# Patient Record
Sex: Female | Born: 1975 | Race: Black or African American | Hispanic: No | Marital: Married | Smoking: Never smoker
Health system: Southern US, Community
[De-identification: ages and names within clinical notes are randomized; demographics above are authoritative.]

## PROBLEM LIST (undated history)

## (undated) DIAGNOSIS — G4700 Insomnia, unspecified: Secondary | ICD-10-CM

## (undated) DIAGNOSIS — T7840XA Allergy, unspecified, initial encounter: Secondary | ICD-10-CM

## (undated) DIAGNOSIS — I10 Essential (primary) hypertension: Secondary | ICD-10-CM

## (undated) DIAGNOSIS — K589 Irritable bowel syndrome without diarrhea: Secondary | ICD-10-CM

## (undated) DIAGNOSIS — G8929 Other chronic pain: Secondary | ICD-10-CM

## (undated) DIAGNOSIS — R51 Headache: Secondary | ICD-10-CM

## (undated) DIAGNOSIS — F39 Unspecified mood [affective] disorder: Secondary | ICD-10-CM

## (undated) DIAGNOSIS — R519 Headache, unspecified: Secondary | ICD-10-CM

## (undated) HISTORY — DX: Unspecified mood (affective) disorder: F39

## (undated) HISTORY — DX: Insomnia, unspecified: G47.00

## (undated) HISTORY — DX: Irritable bowel syndrome, unspecified: K58.9

## (undated) HISTORY — DX: Allergy, unspecified, initial encounter: T78.40XA

## (undated) HISTORY — DX: Other chronic pain: G89.29

## (undated) HISTORY — DX: Headache, unspecified: R51.9

## (undated) HISTORY — DX: Essential (primary) hypertension: I10

## (undated) HISTORY — DX: Headache: R51

---

## 2002-05-06 ENCOUNTER — Encounter: Admission: RE | Admit: 2002-05-06 | Discharge: 2002-08-04 | Payer: Self-pay | Admitting: Family Medicine

## 2003-06-15 ENCOUNTER — Other Ambulatory Visit: Admission: RE | Admit: 2003-06-15 | Discharge: 2003-06-15 | Payer: Self-pay | Admitting: Family Medicine

## 2003-12-29 ENCOUNTER — Ambulatory Visit (HOSPITAL_COMMUNITY): Admission: RE | Admit: 2003-12-29 | Discharge: 2003-12-29 | Payer: Self-pay | Admitting: Family Medicine

## 2005-12-30 ENCOUNTER — Ambulatory Visit: Payer: Self-pay | Admitting: Family Medicine

## 2005-12-30 ENCOUNTER — Other Ambulatory Visit: Admission: RE | Admit: 2005-12-30 | Discharge: 2005-12-30 | Payer: Self-pay | Admitting: Family Medicine

## 2006-01-13 ENCOUNTER — Encounter: Admission: RE | Admit: 2006-01-13 | Discharge: 2006-04-13 | Payer: Self-pay | Admitting: Family Medicine

## 2006-01-29 ENCOUNTER — Ambulatory Visit: Payer: Self-pay | Admitting: Family Medicine

## 2006-11-28 ENCOUNTER — Ambulatory Visit: Payer: Self-pay | Admitting: Family Medicine

## 2007-01-26 ENCOUNTER — Other Ambulatory Visit: Admission: RE | Admit: 2007-01-26 | Discharge: 2007-01-26 | Payer: Self-pay | Admitting: Family Medicine

## 2007-01-26 ENCOUNTER — Ambulatory Visit: Payer: Self-pay | Admitting: Family Medicine

## 2007-06-17 ENCOUNTER — Ambulatory Visit: Payer: Self-pay | Admitting: Family Medicine

## 2007-06-24 ENCOUNTER — Encounter: Admission: RE | Admit: 2007-06-24 | Discharge: 2007-06-24 | Payer: Self-pay | Admitting: Family Medicine

## 2007-09-28 ENCOUNTER — Ambulatory Visit: Payer: Self-pay | Admitting: Family Medicine

## 2008-04-21 ENCOUNTER — Ambulatory Visit: Payer: Self-pay | Admitting: Family Medicine

## 2008-04-21 ENCOUNTER — Other Ambulatory Visit: Admission: RE | Admit: 2008-04-21 | Discharge: 2008-04-21 | Payer: Self-pay | Admitting: Family Medicine

## 2008-05-03 ENCOUNTER — Ambulatory Visit: Payer: Self-pay | Admitting: Family Medicine

## 2008-06-17 ENCOUNTER — Ambulatory Visit: Payer: Self-pay | Admitting: Family Medicine

## 2008-08-30 ENCOUNTER — Ambulatory Visit: Payer: Self-pay | Admitting: Family Medicine

## 2008-09-13 ENCOUNTER — Ambulatory Visit: Payer: Self-pay | Admitting: Family Medicine

## 2008-12-29 ENCOUNTER — Ambulatory Visit: Payer: Self-pay | Admitting: Family Medicine

## 2009-08-15 ENCOUNTER — Ambulatory Visit: Payer: Self-pay | Admitting: Physician Assistant

## 2009-08-15 ENCOUNTER — Other Ambulatory Visit: Admission: RE | Admit: 2009-08-15 | Discharge: 2009-08-15 | Payer: Self-pay | Admitting: Family Medicine

## 2009-10-23 ENCOUNTER — Ambulatory Visit: Payer: Self-pay | Admitting: Family Medicine

## 2010-03-27 ENCOUNTER — Ambulatory Visit
Admission: RE | Admit: 2010-03-27 | Discharge: 2010-03-27 | Payer: Self-pay | Source: Home / Self Care | Attending: Family Medicine | Admitting: Family Medicine

## 2010-04-24 ENCOUNTER — Ambulatory Visit (INDEPENDENT_AMBULATORY_CARE_PROVIDER_SITE_OTHER): Payer: 59 | Admitting: Family Medicine

## 2010-04-24 DIAGNOSIS — R51 Headache: Secondary | ICD-10-CM

## 2010-04-24 DIAGNOSIS — F39 Unspecified mood [affective] disorder: Secondary | ICD-10-CM

## 2010-06-19 ENCOUNTER — Ambulatory Visit (INDEPENDENT_AMBULATORY_CARE_PROVIDER_SITE_OTHER): Payer: 59 | Admitting: Family Medicine

## 2010-06-19 DIAGNOSIS — R51 Headache: Secondary | ICD-10-CM

## 2010-07-20 ENCOUNTER — Encounter: Payer: Self-pay | Admitting: Family Medicine

## 2010-07-24 ENCOUNTER — Encounter: Payer: Self-pay | Admitting: Family Medicine

## 2010-07-24 ENCOUNTER — Ambulatory Visit (INDEPENDENT_AMBULATORY_CARE_PROVIDER_SITE_OTHER): Payer: 59 | Admitting: Family Medicine

## 2010-07-24 VITALS — BP 144/90 | HR 100 | Wt 290.0 lb

## 2010-07-24 DIAGNOSIS — E669 Obesity, unspecified: Secondary | ICD-10-CM

## 2010-07-24 DIAGNOSIS — N926 Irregular menstruation, unspecified: Secondary | ICD-10-CM

## 2010-07-24 DIAGNOSIS — Z8742 Personal history of other diseases of the female genital tract: Secondary | ICD-10-CM

## 2010-07-24 DIAGNOSIS — N912 Amenorrhea, unspecified: Secondary | ICD-10-CM

## 2010-07-24 LAB — POCT URINE PREGNANCY: Preg Test, Ur: NEGATIVE

## 2010-07-24 LAB — HCG, SERUM, QUALITATIVE: Preg, Serum: NEGATIVE

## 2010-07-24 NOTE — Progress Notes (Signed)
  Subjective:    Patient ID: Tamara Mckenzie, female    DOB: [birth year], 35 y.o.   MRN: [id number]  HPI she has missed the last 3 menstrual cycles and is concerned about pregnancy. She has had 3 home pregnancy tests were negative. She is normally fairly regular. She presently isn't on birth control because several of them cause worsening of her headaches. She is here for recheck on her headaches. On her last visit the amitriptyline was increased to 50 mg. She states she is roughly 80% better. She gets a headache she can take a BC powder and it will go away. Now she gets one headache a week.    Review of Systems     Objective:   Physical Exam alert and in no distress. Urine pregnancy test negative.        Assessment & Plan:  Amenorrhea. Obesity. I will do a serum pregnancy test. We also discussed diet and exercise as well as a psychological component of this. We've discussed surgical intervention. I will refer her to nutritionist and possibly to counseling.

## 2010-07-24 NOTE — Patient Instructions (Signed)
I will call you with the information on the nutritionist well as the report on the serum pregnancy test

## 2010-07-26 ENCOUNTER — Telehealth: Payer: Self-pay

## 2010-07-26 NOTE — Telephone Encounter (Signed)
Called and left message to let pt know labs come back normal

## 2010-11-28 ENCOUNTER — Other Ambulatory Visit (INDEPENDENT_AMBULATORY_CARE_PROVIDER_SITE_OTHER): Payer: 59

## 2010-11-28 DIAGNOSIS — Z23 Encounter for immunization: Secondary | ICD-10-CM

## 2011-02-04 ENCOUNTER — Other Ambulatory Visit: Payer: Self-pay | Admitting: Obstetrics and Gynecology

## 2011-02-04 DIAGNOSIS — R928 Other abnormal and inconclusive findings on diagnostic imaging of breast: Secondary | ICD-10-CM

## 2011-02-21 ENCOUNTER — Ambulatory Visit
Admission: RE | Admit: 2011-02-21 | Discharge: 2011-02-21 | Disposition: A | Discharge status: Home / Self Care | Payer: 59 | Source: Ambulatory Visit | Attending: Obstetrics and Gynecology | Admitting: Obstetrics and Gynecology

## 2011-02-21 DIAGNOSIS — R928 Other abnormal and inconclusive findings on diagnostic imaging of breast: Secondary | ICD-10-CM

## 2011-02-21 LAB — HM MAMMOGRAPHY

## 2011-03-12 ENCOUNTER — Encounter: Payer: Self-pay | Admitting: Medical

## 2011-03-12 ENCOUNTER — Ambulatory Visit (INDEPENDENT_AMBULATORY_CARE_PROVIDER_SITE_OTHER): Payer: 59 | Admitting: Medical

## 2011-03-12 VITALS — BP 133/80 | HR 80 | Temp 99.1°F | Resp 16 | Wt 290.0 lb

## 2011-03-12 DIAGNOSIS — J329 Chronic sinusitis, unspecified: Secondary | ICD-10-CM

## 2011-03-12 DIAGNOSIS — J029 Acute pharyngitis, unspecified: Secondary | ICD-10-CM | POA: Insufficient documentation

## 2011-03-12 LAB — POCT RAPID STREP A (OFFICE): Rapid Strep A Screen: NEGATIVE

## 2011-03-12 MED ORDER — AMOXICILLIN-POT CLAVULANATE 875-125 MG PO TABS: 1.0000 | ORAL_TABLET | Freq: Two times a day (BID) | ORAL | Status: AC

## 2011-03-12 NOTE — Progress Notes (Signed)
Subjective:  Tamara Mckenzie is a 36 y.o. female who presents for 4-5 day history of scratchy throat, sinus pressure, left ear fullness, occasional cough, some yellow/clear nasal drainage, one-day history of body aches, and feeling hot and cold alternatively. She has positive sick contacts at work. In the past upper respiratory infections turn into sinus infections easily with her. Her last similar episode was over year ago that required steroid and antibiotic.  She did get a flu shot this year . No other aggravating or relieving factors.  No other c/o.  Past Medical History  Diagnosis Date  . Hypertension   . IBS (irritable bowel syndrome)   . Genital herpes    Review of systems: Gen.: Positive sweats Cardiac: No chest pain or palpitations Lungs: No shortness of breath or wheezing GI: Negative   Objective:   Filed Vitals:   03/12/11 1056  BP: 133/80  Pulse: 80  Temp: 99.1 F (37.3 C)  Resp: 16    General appearance: Alert, WD/WN, no distress                             Skin: warm, no rash                           Head: + Moderate frontal sinus tenderness,                            Eyes: conjunctiva normal, corneas clear, PERRLA                            Ears: pearly TMs, external ear canals normal                          Nose: septum midline, turbinates swollen, with erythema and clear discharge             Mouth/throat: MMM, tongue normal, mild pharyngeal erythema                           Neck: supple, no adenopathy, no thyromegaly, nontender                          Heart: RRR, normal S1, S2, no murmurs                         Lungs: CTA bilaterally, no wheezes, rales, or rhonchi      Assessment and Plan:   Encounter Diagnoses  Name Primary?  . Sinusitis Yes  . Pharyngitis    Strep test negative. Prescription given for Augmentin.  Can use OTC Coricidin HBP for congestion.  Tylenol or Ibuprofen OTC for fever and malaise.  Discussed symptomatic relief, nasal saline,  and call or return if worse or not improving in 2-3 days.

## 2011-03-12 NOTE — Patient Instructions (Signed)

## 2011-04-17 ENCOUNTER — Encounter: Payer: Self-pay | Admitting: Medical

## 2011-04-17 ENCOUNTER — Ambulatory Visit (INDEPENDENT_AMBULATORY_CARE_PROVIDER_SITE_OTHER): Payer: BC Managed Care – PPO | Admitting: Medical

## 2011-04-17 VITALS — BP 138/90 | HR 96 | Temp 98.7°F | Resp 18 | Wt 288.5 lb

## 2011-04-17 DIAGNOSIS — I10 Essential (primary) hypertension: Secondary | ICD-10-CM

## 2011-04-17 DIAGNOSIS — H539 Unspecified visual disturbance: Secondary | ICD-10-CM

## 2011-04-17 MED ORDER — ATENOLOL 50 MG PO TABS: 50.0000 mg | ORAL_TABLET | Freq: Every day | ORAL | Status: DC

## 2011-04-17 NOTE — Patient Instructions (Signed)
Recheck 2-3 weeks

## 2011-04-17 NOTE — Progress Notes (Signed)
Subjective:   HPI  Tamara Mckenzie is a 36 y.o. female who presents with c/o vision disturbance and BP elevated.  She has hx/o hypertension, and she notes 2 days ago was at dinner when all of the sudden she felt her vision go out in both eyes for 3-4 seconds.  She had some ringing in her ears as well, but otherwise felt fine.  She had no other symptoms at the time.  She thinks it caught her off guard, but denies unconscious, syncope, faint, or other.  This even scared her, but otherwise had no symptoms.  She has also been noticing that her BP has been running 130-160 SBP and 88-97 DBP.  She is exercising regularly.  She did begin OCPs 3 months ago.  No hx/o DVT/PE.  She thinks at her last vision exam in the past year, there was some mention about BP related changes in the back of her eyes, but no glaucoma.  She denies hx/o heart problems, seizure, TIA/CVA.    Although she had no other symptoms with the vision disturbance, she does get 4-5 headaches per month, usually one sided on the right, lasts 45-72min.  The headaches usually cause photophobia, but no nausea.  She also has hx/o TMJ problems.  She notes no recent stressors, no recent allergy or sinus problems, not using much caffeine.  She is a nonsmoker.  No other c/o.  The following portions of the patient's history were reviewed and updated as appropriate: allergies, current medications, past family history, past medical history, past social history, past surgical history and problem list.  No Known Allergies  Current Outpatient Prescriptions on File Prior to Visit  Medication Sig Dispense Refill  . ALPRAZolam (XANAX) 0.25 MG tablet Take 0.25 mg by mouth at bedtime as needed.        Marland Kitchen amitriptyline (ELAVIL) 25 MG tablet Take 25 mg by mouth at bedtime.        . drospirenone-ethinyl estradiol (YASMIN,ZARAH,SYEDA) 3-0.03 MG tablet Take 1 tablet by mouth daily.      . fluticasone (FLONASE) 50 MCG/ACT nasal spray 2 sprays by Nasal route daily.         . valACYclovir (VALTREX) 500 MG tablet Take 500 mg by mouth daily.        . vitamin C (ASCORBIC ACID) 500 MG tablet Take 500 mg by mouth daily.          Past Medical History  Diagnosis Date  . Hypertension   . IBS (irritable bowel syndrome)   . Genital herpes   . Mood disorder     History reviewed. No pertinent past surgical history.  Family History  Problem Relation Age of Onset  . Hypertension Mother   . Hypertension Father   . Hyperlipidemia Father   . Stroke Maternal Grandfather   . Heart disease Paternal Grandfather     History   Social History  . Marital Status: Single    Spouse Name: N/A    Number of Children: N/A  . Years of Education: N/A   Occupational History  . Not on file.   Social History Main Topics  . Smoking status: Never Smoker   . Smokeless tobacco: Never Used  . Alcohol Use: No  . Drug Use: No  . Sexually Active: Not on file   Other Topics Concern  . Not on file   Social History Narrative  . No narrative on file    Review of Systems Gen: No fever, chills, sweats Skin: No  rash HEENT: Negative Heart: No chest pain, occasional palpitation, no edema Lungs: No shortness or wheezing GI: No pain, nausea vomiting diarrhea GU negative Neuro: tinnitus, vision disturbance, otherwise negative     Objective:   Physical Exam  General appearance: alert, no distress, WD/WN HEENT: normocephalic, sclerae anicteric, TMs pearly, nares patent, no discharge or erythema, pharynx normal Oral cavity: MMM, no lesions Neck: supple, no lymphadenopathy, no thyromegaly, no masses, no bruits Heart: RRR, normal S1, S2, no murmurs Lungs: CTA bilaterally, no wheezes, rhonchi, or rales Pulses: 2+ symmetric, upper and lower extremities, normal cap refill Neuro: CN2-12 intact, normal strength and sensation, A&O x 4, non focal   Assessment and Plan :     Encounter Diagnoses  Name Primary?  . Essential hypertension, benign Yes  . Vision changes    HTN and  vision changes - we discussed her symptoms and concerns .  The symptoms were brief and have not returned.  Discussed differential that could include hypertension, atypical migraine, seizure, other, and at this point she declines additional evaluation. She will watch for any new changes or recurrence of the symptoms. We discussed signs of stroke or seizure that would prompt a call 911. I did increase her atenolol to 50 mg daily. She'll return in 2-3 weeks, or call return sooner if needed.

## 2011-05-03 ENCOUNTER — Encounter: Payer: Self-pay | Admitting: Medical

## 2011-05-03 ENCOUNTER — Ambulatory Visit (INDEPENDENT_AMBULATORY_CARE_PROVIDER_SITE_OTHER): Payer: BC Managed Care – PPO | Admitting: Medical

## 2011-05-03 VITALS — BP 122/80 | HR 92 | Temp 98.9°F | Resp 18 | Wt 292.0 lb

## 2011-05-03 DIAGNOSIS — E669 Obesity, unspecified: Secondary | ICD-10-CM

## 2011-05-03 DIAGNOSIS — A6000 Herpesviral infection of urogenital system, unspecified: Secondary | ICD-10-CM

## 2011-05-03 DIAGNOSIS — I10 Essential (primary) hypertension: Secondary | ICD-10-CM | POA: Insufficient documentation

## 2011-05-03 MED ORDER — VALACYCLOVIR HCL 500 MG PO TABS: 500.0000 mg | ORAL_TABLET | Freq: Every day | ORAL | Status: DC

## 2011-05-03 NOTE — Progress Notes (Signed)
Subjective:   HPI  Tamara Mckenzie is a 36 y.o. female who presents for recheck.  Last visit she had some vision disturbance and dizziness.  Since last visit increasing her BP medication, she has had no further symptoms. Feels fine.  She has been checking BPs, and they have all been in normal range now - 120s/80s.  She has also had less headaches since last visit.   She needs refill on Valrex today.  diagnosis with genital herpes in 2010.    No other c/o.  The following portions of the patient's history were reviewed and updated as appropriate: allergies, current medications, past family history, past medical history, past social history, past surgical history and problem list.  No Known Allergies  Current Outpatient Prescriptions on File Prior to Visit  Medication Sig Dispense Refill  . ALPRAZolam (XANAX) 0.25 MG tablet Take 0.25 mg by mouth at bedtime as needed.        Marland Kitchen amitriptyline (ELAVIL) 25 MG tablet Take 25 mg by mouth at bedtime.        Marland Kitchen atenolol (TENORMIN) 50 MG tablet Take 1 tablet (50 mg total) by mouth daily.  90 tablet  0  . drospirenone-ethinyl estradiol (YASMIN,ZARAH,SYEDA) 3-0.03 MG tablet Take 1 tablet by mouth daily.      . fluticasone (FLONASE) 50 MCG/ACT nasal spray 2 sprays by Nasal route daily.        . vitamin C (ASCORBIC ACID) 500 MG tablet Take 500 mg by mouth daily.          Past Medical History  Diagnosis Date  . Hypertension   . IBS (irritable bowel syndrome)   . Genital herpes   . Mood disorder     No past surgical history on file.  Family History  Problem Relation Age of Onset  . Hypertension Mother   . Hypertension Father   . Hyperlipidemia Father   . Stroke Maternal Grandfather   . Heart disease Paternal Grandfather     History   Social History  . Marital Status: Single    Spouse Name: N/A    Number of Children: N/A  . Years of Education: N/A   Occupational History  . Not on file.   Social History Main Topics  . Smoking status:  Never Smoker   . Smokeless tobacco: Never Used  . Alcohol Use: No  . Drug Use: No  . Sexually Active: Not on file   Other Topics Concern  . Not on file   Social History Narrative  . No narrative on file    Review of Systems Gen: No fever, chills, sweats Skin: No rash HEENT: Negative Heart: No chest pain, occasional palpitation, no edema Lungs: No shortness or wheezing GI: No pain, nausea vomiting diarrhea GU negative Neuro: negative, no further dizziness     Objective:   Physical Exam  General appearance: alert, no distress, WD/WN Oral cavity: MMM, no lesions Heart: RRR, normal S1, S2, no murmurs Lungs: CTA bilaterally, no wheezes, rhonchi, or rales Pulses: 2+ symmetric, upper and lower extremities, normal cap refill  Assessment and Plan :     Encounter Diagnoses  Name Primary?  . Essential hypertension, benign Yes  . Genital herpes   . Obesity    HTN - improved on Atenolol 50mg  daily.  No further dizziness or spells as she had last visit.   Genital herpes - controled with once daily Valtrex.  Refill today.   Obesity - discussed diet, exercise, and she will work on  diet, c/t exercise, and recheck 59mo for physical.

## 2011-06-22 ENCOUNTER — Other Ambulatory Visit: Payer: Self-pay | Admitting: Family Medicine

## 2011-09-13 ENCOUNTER — Ambulatory Visit (INDEPENDENT_AMBULATORY_CARE_PROVIDER_SITE_OTHER): Payer: BC Managed Care – PPO | Admitting: Medical

## 2011-09-13 ENCOUNTER — Encounter: Payer: Self-pay | Admitting: Medical

## 2011-09-13 VITALS — BP 110/80 | HR 100 | Temp 98.9°F | Resp 16 | Wt 280.0 lb

## 2011-09-13 DIAGNOSIS — Z113 Encounter for screening for infections with a predominantly sexual mode of transmission: Secondary | ICD-10-CM

## 2011-09-13 DIAGNOSIS — E669 Obesity, unspecified: Secondary | ICD-10-CM

## 2011-09-13 DIAGNOSIS — I10 Essential (primary) hypertension: Secondary | ICD-10-CM

## 2011-09-13 LAB — POCT URINALYSIS DIPSTICK
Bilirubin, UA: NEGATIVE
Blood, UA: NEGATIVE
Glucose, UA: NEGATIVE
Ketones, UA: NEGATIVE
Leukocytes, UA: NEGATIVE
Nitrite, UA: NEGATIVE
Protein, UA: NEGATIVE
Spec Grav, UA: 1.02
Urobilinogen, UA: NEGATIVE
pH, UA: 5

## 2011-09-13 LAB — CBC
HCT: 37.4 % (ref 36.0–46.0)
Hemoglobin: 12.4 g/dL (ref 12.0–15.0)
MCH: 27.6 pg (ref 26.0–34.0)
MCHC: 33.2 g/dL (ref 30.0–36.0)
MCV: 83.3 fL (ref 78.0–100.0)
Platelets: 445 10*3/uL — ABNORMAL HIGH (ref 150–400)
RBC: 4.49 MIL/uL (ref 3.87–5.11)
RDW: 15.2 % (ref 11.5–15.5)
WBC: 6 10*3/uL (ref 4.0–10.5)

## 2011-09-13 MED ORDER — ATENOLOL 50 MG PO TABS: 50.0000 mg | ORAL_TABLET | Freq: Every day | ORAL | Status: DC

## 2011-09-13 NOTE — Progress Notes (Signed)
Subjective:   HPI  Tamara Mckenzie is a 36 y.o. female who presents for general recheck.  She is compliant with her blood pressure medication.  She has lost weight, been exercising with a combination of walking, Zumba and other activity as well as improving her diet.  She got a puppy and the puppy is active keeping her exercising.  She is fasting today for labs.   Takes Elavil for mood, sleep, and started this back in 2012.  Does well on this.   No other c/o.  The following portions of the patient's history were reviewed and updated as appropriate: allergies, current medications, past family history, past medical history, past social history, past surgical history and problem list.  Past Medical History  Diagnosis Date  . Hypertension   . IBS (irritable bowel syndrome)   . Genital herpes   . Mood disorder   . Chronic headache   . Insomnia     No Known Allergies   Review of Systems ROS reviewed and was negative other than noted in HPI or above.    Objective:   Physical Exam  General appearance: alert, no distress, WD/WN Oral cavity: MMM, no lesions Neck: supple, no lymphadenopathy, no thyromegaly, no masses Heart: RRR, normal S1, S2, no murmurs Lungs: CTA bilaterally, no wheezes, rhonchi, or rales Pulses: 2+ symmetric, upper and lower extremities, normal cap refill   Assessment and Plan :     Encounter Diagnoses  Name Primary?  . Essential hypertension, benign Yes  . Obesity   . Screening for STD (sexually transmitted disease)    HTN - controlled, c/t current medication, refills sent.  Obesity - she has lost weight and made significant diet and exercise changes.  C/t the good work  STD screening today  reviewed 2011 labs and pap.   Next pap due 2014.  Fasting labs today.

## 2011-09-13 NOTE — Addendum Note (Signed)
Addended by: Janeice Robinson on: 09/13/2011 10:38 AM   Modules accepted: Orders

## 2011-09-14 LAB — HIV ANTIBODY (ROUTINE TESTING W REFLEX): HIV: NONREACTIVE

## 2011-09-14 LAB — COMPREHENSIVE METABOLIC PANEL
ALT: 13 U/L (ref 0–35)
AST: 15 U/L (ref 0–37)
Albumin: 3.8 g/dL (ref 3.5–5.2)
Alkaline Phosphatase: 85 U/L (ref 39–117)
BUN: 10 mg/dL (ref 6–23)
CO2: 24 mEq/L (ref 19–32)
Calcium: 8.8 mg/dL (ref 8.4–10.5)
Chloride: 106 mEq/L (ref 96–112)
Creat: 0.78 mg/dL (ref 0.50–1.10)
Glucose, Bld: 84 mg/dL (ref 70–99)
Potassium: 4.3 mEq/L (ref 3.5–5.3)
Sodium: 138 mEq/L (ref 135–145)
Total Bilirubin: 0.3 mg/dL (ref 0.3–1.2)
Total Protein: 7.4 g/dL (ref 6.0–8.3)

## 2011-09-14 LAB — LIPID PANEL
Cholesterol: 168 mg/dL (ref 0–200)
HDL: 81 mg/dL (ref >39)
LDL Cholesterol: 70 mg/dL (ref 0–99)
Total CHOL/HDL Ratio: 2.1 Ratio
Triglycerides: 83 mg/dL (ref <150)
VLDL: 17 mg/dL (ref 0–40)

## 2011-09-14 LAB — GC/CHLAMYDIA PROBE AMP, URINE
Chlamydia, Swab/Urine, PCR: NEGATIVE
GC Probe Amp, Urine: NEGATIVE

## 2011-09-14 LAB — TSH: TSH: 1.624 u[IU]/mL (ref 0.350–4.500)

## 2011-09-29 ENCOUNTER — Other Ambulatory Visit: Payer: Self-pay | Admitting: Medical

## 2012-02-06 ENCOUNTER — Encounter: Payer: Self-pay | Admitting: Family Medicine

## 2012-02-06 ENCOUNTER — Ambulatory Visit (INDEPENDENT_AMBULATORY_CARE_PROVIDER_SITE_OTHER): Payer: BC Managed Care – PPO | Admitting: Family Medicine

## 2012-02-06 VITALS — BP 160/110 | HR 100 | Wt 291.0 lb

## 2012-02-06 DIAGNOSIS — I10 Essential (primary) hypertension: Secondary | ICD-10-CM

## 2012-02-06 DIAGNOSIS — J209 Acute bronchitis, unspecified: Secondary | ICD-10-CM

## 2012-02-06 DIAGNOSIS — J0410 Acute tracheitis without obstruction: Secondary | ICD-10-CM

## 2012-02-06 MED ORDER — ALBUTEROL SULFATE HFA 108 (90 BASE) MCG/ACT IN AERS: 2.0000 | INHALATION_SPRAY | Freq: Four times a day (QID) | RESPIRATORY_TRACT | Status: DC | PRN

## 2012-02-06 MED ORDER — AMOXICILLIN 875 MG PO TABS: 875.0000 mg | ORAL_TABLET | Freq: Two times a day (BID) | ORAL | Status: DC

## 2012-02-06 NOTE — Progress Notes (Signed)
  Subjective:    Patient ID: Tamara Mckenzie, female    DOB: [birth year], 36 y.o.   MRN: [id number]  HPI 2 weeks ago she started with rhinorrhea and PND followed a few days later with chest congestion followed by coughing. About 3 or 4 days ago she then had difficulty with headache however coughing does not make this worse. No fever chills, sore throat or earache. She does not smoke or have underlying allergies   Review of Systems     Objective:   Physical Exam alert and in no distress. Tympanic membranes and canals are normal. Throat is clear. Tonsils are normal. Neck is supple without adenopathy or thyromegaly. Cardiac exam shows a regular sinus rhythm without murmurs or gallops. Lungs are clear to auscultation.        Assessment & Plan:   1. Acute bronchitis  amoxicillin (AMOXIL) 875 MG tablet  2. Acute tracheitis  albuterol (PROVENTIL HFA;VENTOLIN HFA) 108 (90 BASE) MCG/ACT inhaler  3. Essential hypertension, benign     she is to return in one month for recheck on her blood pressure.

## 2012-02-06 NOTE — Patient Instructions (Signed)
Take 2 Aleve twice a day and use the antibiotic and I am also going to give you an inhaler. Use the inhaler to help with the coughing

## 2012-03-09 ENCOUNTER — Encounter: Payer: Self-pay | Admitting: Family Medicine

## 2012-03-09 ENCOUNTER — Ambulatory Visit (INDEPENDENT_AMBULATORY_CARE_PROVIDER_SITE_OTHER): Payer: BC Managed Care – PPO | Admitting: Family Medicine

## 2012-03-09 VITALS — BP 120/78 | HR 98 | Wt 284.0 lb

## 2012-03-09 DIAGNOSIS — J209 Acute bronchitis, unspecified: Secondary | ICD-10-CM

## 2012-03-09 MED ORDER — AMOXICILLIN 875 MG PO TABS: 875.0000 mg | ORAL_TABLET | Freq: Two times a day (BID) | ORAL | Status: DC

## 2012-03-09 NOTE — Progress Notes (Signed)
  Subjective:    Patient ID: Tamara Mckenzie, female    DOB: [birth year], 37 y.o.   MRN: [id number]  HPI She is here for recheck on her blood pressure. In the past when she was on birth control pills she noted a decrease in her blood pressure. She has been on Yaz or one year for cycle control. She stopped this 3 weeks ago and notes now that her blood pressure is normal. He also has continued difficulty with chest congestion and coughing. She does state that she is roughly 90% better from her last visit.   Review of Systems     Objective:   Physical Exam alert and in no distress. Tympanic membranes and canals are normal. Throat is clear. Tonsils are normal. Neck is supple without adenopathy or thyromegaly. Cardiac exam shows a regular sinus rhythm without murmurs or gallops. Lungs are clear to auscultation.        Assessment & Plan:   1. Acute bronchitis  amoxicillin (AMOXIL) 875 MG tablet   she will call if any problems. Since her blood pressure is now normal, no further intervention is needed.

## 2012-03-09 NOTE — Patient Instructions (Signed)
Take all the antibiotic. Increase your physical activities that your body will allow

## 2012-05-25 IMAGING — US US BREAST L
1 series · 1 of 1 positions shown · non-contrast
Comparison: 01/30/2011

CLINICAL DATA: The patient returns after baseline screening exam
for evaluation of the left breast.  Baseline exam was ordered
because the patient has two aunts with a history of breast cancer.

DIGITAL DIAGNOSTIC LEFT MAMMOGRAM  AND LEFT BREAST ULTRASOUND:

[Series 1: us breast left · 1 of 1 slices shown]
[im 1/1]
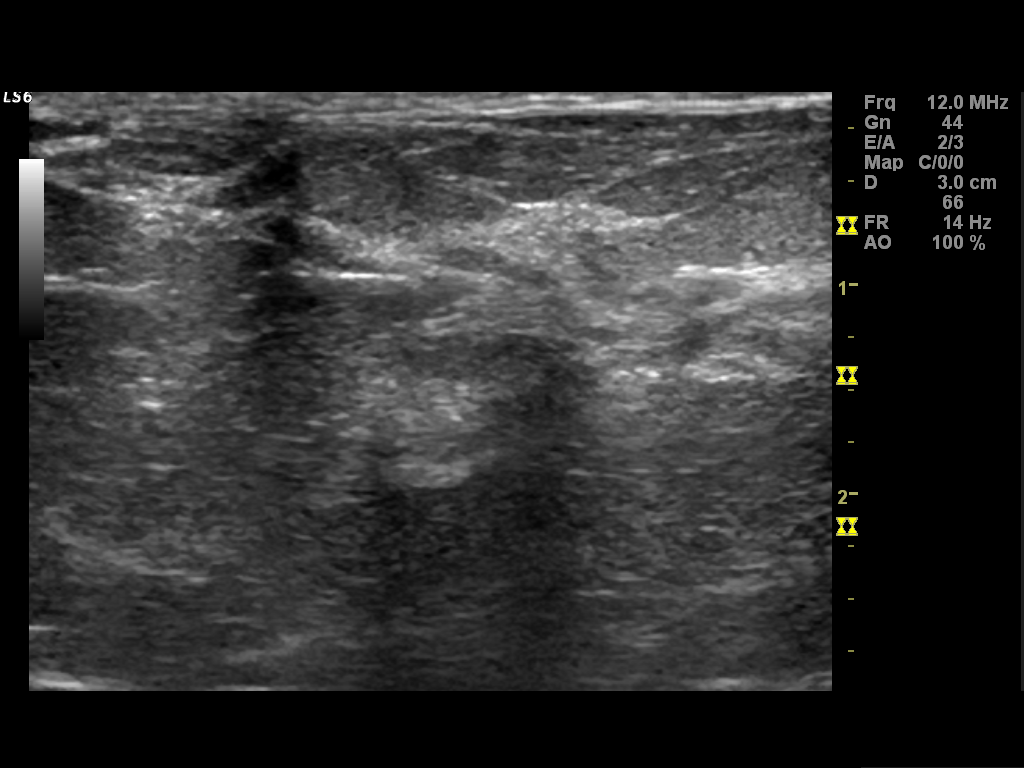

[1 of 1 positions shown; findings below may reference images not displayed]

FINDINGS: Spot compression views show no persistent abnormality
within the upper-outer quadrant of the left breast.

On physical exam, I palpate no abnormality within the upper outer
quadrant on the left.

Ultrasound is performed, showing normal-appearing fibroglandular
tissue throughout the upper-outer quadrant of the left breast.
Fibroglandular ridge is identified in the 3 o'clock location,
correlating well with the mammographic finding. No mass,
distortion, or acoustic shadowing is demonstrated with ultrasound.
IMPRESSION: No mammographic or ultrasound evidence for malignancy.  Annual
screening mammography is recommended to begin at age 40.

BI-RADS CATEGORY 1:  Negative.

## 2012-06-03 ENCOUNTER — Telehealth: Payer: Self-pay | Admitting: Medical

## 2012-06-03 NOTE — Telephone Encounter (Signed)
Received fax refill request from [facility] for Amitriptyline HCL 50  90 day supply

## 2012-06-04 ENCOUNTER — Other Ambulatory Visit: Payer: Self-pay | Admitting: Medical

## 2012-06-04 MED ORDER — AMITRIPTYLINE HCL 50 MG PO TABS: 50.0000 mg | ORAL_TABLET | Freq: Every day | ORAL | Status: DC

## 2012-07-25 ENCOUNTER — Other Ambulatory Visit: Payer: Self-pay | Admitting: Medical

## 2012-08-11 ENCOUNTER — Ambulatory Visit (INDEPENDENT_AMBULATORY_CARE_PROVIDER_SITE_OTHER): Payer: BC Managed Care – PPO | Admitting: Medical

## 2012-08-11 ENCOUNTER — Encounter: Payer: Self-pay | Admitting: Medical

## 2012-08-11 VITALS — BP 140/100 | HR 82 | Temp 98.3°F | Resp 18 | Wt 287.0 lb

## 2012-08-11 DIAGNOSIS — G8929 Other chronic pain: Secondary | ICD-10-CM

## 2012-08-11 DIAGNOSIS — R51 Headache: Secondary | ICD-10-CM

## 2012-08-11 DIAGNOSIS — I10 Essential (primary) hypertension: Secondary | ICD-10-CM

## 2012-08-11 MED ORDER — TOPIRAMATE 25 MG PO TABS: 25.0000 mg | ORAL_TABLET | Freq: Every day | ORAL | Status: DC

## 2012-08-11 MED ORDER — CYCLOBENZAPRINE HCL 10 MG PO TABS: 10.0000 mg | ORAL_TABLET | Freq: Every evening | ORAL | Status: DC | PRN

## 2012-08-11 NOTE — Progress Notes (Signed)
Subjective:  Tamara Mckenzie is a 37 y.o. female who presents with headaches.  Current headache persistent the last 2 weeks.  Primarily frontal and some around eyes, pain behind eyes too, headache is bilat across the front.  Headache is dull like a sinus headache, but denies sinus problems.  Has seasonal allergies, but no current sneezing, runny nose, itching, sore throat.  She had some flashes of light in vision over the weekend with the worse headache, some photophobia, but this isn't all that typical for her.  Weekend headaches were 10/10, but on average 7-8/10.   Usually lasts about 2 hours.   Uses tylenol which sometimes helps.   Over the weekend took [place] powder with some relief.  Checked BPs and some elevated readings over the weekend.  Usually 133/86.  Denies visual changes.  Gets group of headaches for a few weeks, then they resolve.  In the past while on OCPs, headaches were improved, however, this seems to raise her BPs.  Sees gyn/Dr. Henderson Cloud.  Has been on ortho tri cyclen, Yaz.  Was advised to do headache diary in the past, but she has never done this.  Taking Atenolol.  Been on amitriptyline for mood and headaches.  Denies regular caffeine use.  For bad headaches uses [place] powders, Tylenol or Excedrin.  No other aggravating or relieving factors.  No red flag symptoms. No other c/o.  The following portions of the patient's history were reviewed and updated as appropriate: allergies, current medications, past family history, past medical history, past social history, past surgical history and problem list.  Past Medical History  Diagnosis Date  . Hypertension   . IBS (irritable bowel syndrome)   . Genital herpes   . Mood disorder   . Chronic headache   . Insomnia   . Allergy     ROS Otherwise as in subjective above  Objective: Physical Exam  Vital signs reviewed  General appearance: alert, no distress, WD/WN HEENT: normocephalic, sclerae anicteric, conjunctiva pink and moist, TMs  pearly, nares patent, no discharge or erythema, pharynx normal Oral cavity: MMM, no lesions Neck: supple, no lymphadenopathy, no thyromegaly, no masses Heart: RRR, normal S1, S2, no murmurs Lungs: CTA bilaterally, no wheezes, rhonchi, or rales Abdomen: +bs, soft, non tender, non distended, no masses, no hepatomegaly, no splenomegaly Pulses: 2+ radial pulses, 2+ pedal pulses, normal cap refill Ext: no edema Neuro: CN2-12 intact, normal strength, sensation, and DTRs, nonfocal exam   Assessment: Encounter Diagnoses  Name Primary?  . Chronic headache Yes  . Essential hypertension, benign      Plan: Discussed her chronic frequent headaches, possible menstrual related, combination of tension and migraine type headaches.   Advised headache diary.  To break current headache cycle, begin Flexeril QHS prn the next few nights.  Discussed medication options, discussed risks/benefits of Topamax.  Based on most recent clinic readings and her own home BP readings, c/t Atenolol 50mg  daily.  For better control, begin Topamax in the mornings and cut Amitriptyline in half at bedtime.    The Amitriptyline works for sleep but doesn't seem to help her headaches.  Advised she avoid frequent OTC analgesics, avoid skipping meals, avoid frequent caffeine use, use stretching and exercise to help with neck tension, avoid stress or reduce stress where possible, consider massage periodically.  Recheck in 3-[redacted] wk along with headache diary.

## 2012-08-11 NOTE — Patient Instructions (Signed)
Tonight, take 1/2- 1 tablet of Flexeril muscle relaxer to break the current headache.  This will make you sleepy, so only use at bedtime.  Over the next few days, if you need to use this or Excedrin occasionally, then that is fine.  Cut your Amitriptyline to 1/2 tablet at bedtime, and begin Topamax 25mg  daily in the morning.    Start keeping a headache diary.  Listing when you get the headaches (date and time), when was last meal, what you think triggered the headache, what did you take to improve the headache.  List any other factor you think is relevant.

## 2012-09-12 ENCOUNTER — Other Ambulatory Visit: Payer: Self-pay | Admitting: Medical

## 2012-09-14 NOTE — Telephone Encounter (Signed)
Need OV recheck on headaches, bring diary with her

## 2012-09-14 NOTE — Telephone Encounter (Signed)
Is it okay to refill the Flexeril? She was last seen 07/2012

## 2012-10-11 ENCOUNTER — Other Ambulatory Visit: Payer: Self-pay | Admitting: Medical

## 2012-10-12 ENCOUNTER — Other Ambulatory Visit: Payer: Self-pay | Admitting: Medical

## 2012-11-09 ENCOUNTER — Other Ambulatory Visit: Payer: Self-pay | Admitting: Medical

## 2012-11-10 NOTE — Telephone Encounter (Signed)
Is this okay to refill? This is a Tamara Mckenzie

## 2012-12-16 ENCOUNTER — Other Ambulatory Visit: Payer: Self-pay | Admitting: Medical

## 2012-12-16 ENCOUNTER — Other Ambulatory Visit: Payer: Self-pay | Admitting: Family Medicine

## 2012-12-17 ENCOUNTER — Telehealth: Payer: Self-pay | Admitting: *Deleted

## 2012-12-17 NOTE — Telephone Encounter (Signed)
Is this okay to refill? 

## 2012-12-17 NOTE — Telephone Encounter (Signed)
Spoke to patient and she will call back to schedule appointment.

## 2012-12-17 NOTE — Telephone Encounter (Signed)
It looks like she is using this fairly regularly so have her come in to talk to [place] or I about this

## 2013-01-29 ENCOUNTER — Ambulatory Visit (INDEPENDENT_AMBULATORY_CARE_PROVIDER_SITE_OTHER): Payer: BC Managed Care – PPO | Admitting: Family Medicine

## 2013-01-29 ENCOUNTER — Encounter: Payer: Self-pay | Admitting: Family Medicine

## 2013-01-29 VITALS — BP 130/90 | HR 70 | Ht 64.5 in | Wt 298.0 lb

## 2013-01-29 DIAGNOSIS — J309 Allergic rhinitis, unspecified: Secondary | ICD-10-CM | POA: Insufficient documentation

## 2013-01-29 DIAGNOSIS — I10 Essential (primary) hypertension: Secondary | ICD-10-CM

## 2013-01-29 DIAGNOSIS — G43809 Other migraine, not intractable, without status migrainosus: Secondary | ICD-10-CM | POA: Insufficient documentation

## 2013-01-29 DIAGNOSIS — G8929 Other chronic pain: Secondary | ICD-10-CM

## 2013-01-29 DIAGNOSIS — Z23 Encounter for immunization: Secondary | ICD-10-CM

## 2013-01-29 DIAGNOSIS — R51 Headache: Secondary | ICD-10-CM

## 2013-01-29 DIAGNOSIS — Z0000 Encounter for general adult medical examination without abnormal findings: Secondary | ICD-10-CM

## 2013-01-29 LAB — CBC WITH DIFFERENTIAL/PLATELET
Basophils Absolute: 0 10*3/uL (ref 0.0–0.1)
Basophils Relative: 0 % (ref 0–1)
Eosinophils Absolute: 0.2 10*3/uL (ref 0.0–0.7)
Eosinophils Relative: 4 % (ref 0–5)
HCT: 37.8 % (ref 36.0–46.0)
MCH: 29.4 pg (ref 26.0–34.0)
MCHC: 34.4 g/dL (ref 30.0–36.0)
MCV: 85.5 fL (ref 78.0–100.0)
Monocytes Absolute: 0.3 10*3/uL (ref 0.1–1.0)
Neutrophils Relative %: 46 % (ref 43–77)
RDW: 13.7 % (ref 11.5–15.5)

## 2013-01-29 LAB — COMPREHENSIVE METABOLIC PANEL
ALT: 18 U/L (ref 0–35)
AST: 17 U/L (ref 0–37)
Albumin: 3.6 g/dL (ref 3.5–5.2)
Alkaline Phosphatase: 82 U/L (ref 39–117)
BUN: 8 mg/dL (ref 6–23)
Calcium: 8.8 mg/dL (ref 8.4–10.5)
Chloride: 104 mEq/L (ref 96–112)
Creat: 0.64 mg/dL (ref 0.50–1.10)
Sodium: 138 mEq/L (ref 135–145)
Total Bilirubin: 0.3 mg/dL (ref 0.3–1.2)

## 2013-01-29 LAB — LIPID PANEL
HDL: 62 mg/dL (ref >39)
LDL Cholesterol: 71 mg/dL (ref 0–99)
Total CHOL/HDL Ratio: 2.4 Ratio
VLDL: 13 mg/dL (ref 0–40)

## 2013-01-29 MED ORDER — AMITRIPTYLINE HCL 25 MG PO TABS
25.0000 mg | ORAL_TABLET | Freq: Every day | ORAL | Status: DC
Start: 2013-01-29 — End: 2013-04-27

## 2013-01-29 NOTE — Patient Instructions (Signed)
Call me in one month and let me know how you're headaches are doing. Call  Ed Lurey [phone] for help with dealing with stress and stress eating

## 2013-01-29 NOTE — Progress Notes (Signed)
Subjective:    Patient ID: Tamara Mckenzie, female    DOB: [birth year], 37 y.o.   MRN: [id number]  HPI She is here for complete examination. She did see her gynecologist yesterday. She has had difficulty with irregular menses. She also has underlying allergies and continues on Flonase for that. She also has a history of chronic daily headaches. She has a confusing history of being on amitriptyline and then subsequently Topamax over she intermittently to both of these due to running out of one or the other of them. She stated that when she stopped the amitriptyline, the headaches actually got better while she is still on Topamax however she is not using them correctly. He also notes that since stopping the amitriptyline, her sleep pattern has gotten worse. She continues to struggle with weight. She does have plenty of workout tapes at home but does not use them. She admits to stress eating. As yet she has not made any efforts to handle stress differently. She does work at home.   Review of Systems Negative except as above    Objective:   Physical Exam BP 130/90  Pulse 70  Ht 5' 4.5" (1.638 m)  Wt 298 lb (135.172 kg)  BMI 50.38 kg/m2  SpO2 99%  LMP 01/29/2013 BP 130/90  Pulse 70  Ht 5' 4.5" (1.638 m)  Wt 298 lb (135.172 kg)  BMI 50.38 kg/m2  SpO2 99%  LMP 01/29/2013  General Appearance:    Alert, cooperative, no distress, appears stated age  Head:    Normocephalic, without obvious abnormality, atraumatic  Eyes:    PERRL, conjunctiva/corneas clear, EOM's intact, fundi    benign  Ears:    Normal TM's and external ear canals  Nose:   Nares normal, mucosa normal, no drainage or sinus   tenderness  Throat:   Lips, mucosa, and tongue normal; teeth and gums normal  Neck:   Supple, no lymphadenopathy;  thyroid:  no   enlargement/tenderness/nodules; no carotid   bruit or JVD  Back:    Spine nontender, no curvature, ROM normal, no CVA     tenderness  Lungs:     Clear to auscultation  bilaterally without wheezes, rales or     ronchi; respirations unlabored  Chest Wall:    No tenderness or deformity   Heart:    Regular rate and rhythm, S1 and S2 normal, no murmur, rub   or gallop  Breast Exam:    Deferred to GYN  Abdomen:     Soft, non-tender, nondistended, normoactive bowel sounds,    no masses, no hepatosplenomegaly  Genitalia:    Deferred to GYN     Extremities:   No clubbing, cyanosis or edema  Pulses:   2+ and symmetric all extremities  Skin:   Skin color, texture, turgor normal, no rashes or lesions  Lymph nodes:   Cervical, supraclavicular, and axillary nodes normal  Neurologic:   CNII-XII intact, normal strength, sensation and gait; reflexes 2+ and symmetric throughout          Psych:   Normal mood, affect, hygiene and grooming.     Assessment & Plan:  Routine general medical examination at a health care facility - Plan: POCT Urinalysis Dipstick, CBC with Differential, Comprehensive metabolic panel, Lipid panel  Need for prophylactic vaccination and inoculation against influenza - Plan: Flu Vaccine QUAD 36+ mos PF IM (Fluarix)  Allergic rhinitis  Morbid obesity - Plan: Comprehensive metabolic panel, Lipid panel, Amb ref to Medical Nutrition Therapy-MNT  Chronic headache - Plan: amitriptyline (ELAVIL) 25 MG tablet  Essential hypertension, benign  Decided to try and start over with headaches. She will continue on Topamax and I will place her back on Elavil. She is to call me in one month to let me know how she is doing. We also discussed stress and stress eating. I will refer her to add Lawson Fiscal to help with this. Will also set her up for a nutritionist. Discussed her physical activity pattern and strongly encouraged her to include some kind of physical activity in her daily regimen.

## 2013-01-31 NOTE — Progress Notes (Signed)
Quick Note:  The blood work is normal ______ 

## 2013-04-14 ENCOUNTER — Other Ambulatory Visit: Payer: Self-pay | Admitting: Medical

## 2013-04-27 ENCOUNTER — Other Ambulatory Visit: Payer: Self-pay | Admitting: Family Medicine

## 2013-06-17 ENCOUNTER — Ambulatory Visit: Payer: BC Managed Care – PPO | Admitting: Family Medicine

## 2013-06-17 ENCOUNTER — Ambulatory Visit (INDEPENDENT_AMBULATORY_CARE_PROVIDER_SITE_OTHER): Payer: BC Managed Care – PPO | Admitting: Family Medicine

## 2013-06-17 ENCOUNTER — Encounter: Payer: Self-pay | Admitting: Family Medicine

## 2013-06-17 VITALS — BP 150/100 | HR 76 | Temp 98.6°F | Wt 289.0 lb

## 2013-06-17 DIAGNOSIS — J019 Acute sinusitis, unspecified: Secondary | ICD-10-CM

## 2013-06-17 MED ORDER — AMOXICILLIN-POT CLAVULANATE 875-125 MG PO TABS: 1.0000 | ORAL_TABLET | Freq: Two times a day (BID) | ORAL | Status: DC

## 2013-06-17 NOTE — Progress Notes (Deleted)
   Subjective:    Patient ID: Tamara Mckenzie, female    DOB: 1975/04/15, 38 y.o.   MRN: [id number]  HPI Msr. Tamara Mckenzie is a very pleasant 38 y.o. yo female who  has a past medical history of Hypertension; IBS (irritable bowel syndrome); Genital herpes; Mood disorder; Chronic headache; Insomnia; and Allergy. SHe presents today for   Starting one week ago the patient began to have symptoms of fatigue with a post nasal drip. Over the course of a couple days these symptoms developed into a sense of pressure in her sinuses, headache, very sore throat, pressure in her ears especially on the left, decreased left ear hearing, and nasal congestion especially on the left side. In the last three days the patient has also developed thick yellow nasal drainage, non-productive dry cough with congestion, SOB with exertion and chest congestion.The patient has a history of allergies, though she hasn't noticed any this year, and tried taking allegra, benadryl and flonase. She feels that only the benadryl may have helped some. The patient denies any objective fever or muscle aches.   Review of Systems is negative except per HPI.    Objective:   Physical Exam  Constitutional: Patient is well-developed, well-nourished, and in no distress. HENT: Head is normocephalic and atraumatic. Sinus tendernes was illicted with palpation of the maxillary sinuses and not the frontal.  Mouth/Throat: Oropharynx is erythematous and swollen without tonsiler enlargement or exudates. Nose: Normal pink mucosa Ears: left TM appears more full and slightly red. Right TM appears normal.  Eyes: Conjunctivae and EOM are normal. Pupils are equal, round, and reactive to light.  Cardiovascular: Normal rate, regular rhythm. Exam reveals no murmurs, gallops and no friction rub.  Pulmonary/Chest: Effort normal and CTAB. No respiratory distress. No wheezes or ronchi.   Psychiatric: Affect normal.     Assessment & Plan:  Acute sinusitis - Plan:  amoxicillin-clavulanate (AUGMENTIN) 875-125 MG per tablet  Patient should return if she is not feeling well in 10 days.

## 2013-06-17 NOTE — Patient Instructions (Signed)
Take all the medicine and if not totally back to normal when you finish call me

## 2013-06-17 NOTE — Progress Notes (Signed)
   Subjective:    Patient ID: Tamara Mckenzie, female    DOB: 11/27/1975, 37 y.o.   MRN: [id number]  HPI Tamara Mckenzie is a very pleasant 37 y.o. yo female who  has a past medical history of Hypertension; IBS (irritable bowel syndrome); Genital herpes; Mood disorder; Chronic headache; Insomnia; and Allergy. SHe presents today for   Starting one week ago the patient began to have symptoms of fatigue with a post nasal drip. Over the course of a couple days these symptoms developed into a sense of pressure in her sinuses, headache, very sore throat, pressure in her ears especially on the left, decreased left ear hearing, and nasal congestion especially on the left side. In the last three days the patient has also developed thick yellow nasal drainage, non-productive dry cough with congestion, SOB with exertion and chest congestion.The patient has a history of allergies, though she hasn't noticed any this year, and tried taking allegra, benadryl and flonase. She feels that only the benadryl may have helped some. The patient denies any objective fever or muscle aches.   Review of Systems is negative except per HPI.    Objective:   Physical Exam  Constitutional: Patient is well-developed, well-nourished, and in no distress. HENT: Head is normocephalic and atraumatic. Sinus tendernes was illicted with palpation of the maxillary sinuses and not the frontal.  Mouth/Throat: Oropharynx is erythematous and swollen without tonsiler enlargement or exudates. Nose: Normal pink mucosa Ears: left TM appears more full and slightly red. Right TM appears normal.  Eyes: Conjunctivae and EOM are normal. Pupils are equal, round, and reactive to light.  Cardiovascular: Normal rate, regular rhythm. Exam reveals no murmurs, gallops and no friction rub.  Pulmonary/Chest: Effort normal and CTAB. No respiratory distress. No wheezes or ronchi.   Psychiatric: Affect normal.     Assessment & Plan:  Acute sinusitis - Plan:  amoxicillin-clavulanate (AUGMENTIN) 875-125 MG per tablet  Patient should return if she is not feeling well in 10 days. 

## 2013-08-04 ENCOUNTER — Other Ambulatory Visit: Payer: Self-pay | Admitting: Family Medicine

## 2013-08-04 NOTE — Telephone Encounter (Signed)
IS THIS OKAY TO REFILL 

## 2013-08-11 ENCOUNTER — Other Ambulatory Visit: Payer: Self-pay | Admitting: Medical

## 2013-10-22 ENCOUNTER — Other Ambulatory Visit: Payer: Self-pay | Admitting: Family Medicine

## 2013-10-22 ENCOUNTER — Other Ambulatory Visit: Payer: Self-pay | Admitting: Medical

## 2014-01-28 ENCOUNTER — Other Ambulatory Visit: Payer: Self-pay | Admitting: Family Medicine

## 2014-02-07 ENCOUNTER — Ambulatory Visit (INDEPENDENT_AMBULATORY_CARE_PROVIDER_SITE_OTHER): Payer: BC Managed Care – PPO | Admitting: Family Medicine

## 2014-02-07 ENCOUNTER — Encounter: Payer: Self-pay | Admitting: Family Medicine

## 2014-02-07 VITALS — BP 130/90 | HR 86 | Ht 65.0 in | Wt 294.0 lb

## 2014-02-07 DIAGNOSIS — E669 Obesity, unspecified: Secondary | ICD-10-CM

## 2014-02-07 DIAGNOSIS — G8929 Other chronic pain: Secondary | ICD-10-CM

## 2014-02-07 DIAGNOSIS — Z0000 Encounter for general adult medical examination without abnormal findings: Secondary | ICD-10-CM

## 2014-02-07 DIAGNOSIS — K117 Disturbances of salivary secretion: Secondary | ICD-10-CM | POA: Insufficient documentation

## 2014-02-07 DIAGNOSIS — R51 Headache: Secondary | ICD-10-CM

## 2014-02-07 DIAGNOSIS — J302 Other seasonal allergic rhinitis: Secondary | ICD-10-CM

## 2014-02-07 DIAGNOSIS — R682 Dry mouth, unspecified: Secondary | ICD-10-CM

## 2014-02-07 DIAGNOSIS — Z23 Encounter for immunization: Secondary | ICD-10-CM

## 2014-02-07 DIAGNOSIS — I10 Essential (primary) hypertension: Secondary | ICD-10-CM

## 2014-02-07 DIAGNOSIS — R5382 Chronic fatigue, unspecified: Secondary | ICD-10-CM

## 2014-02-07 DIAGNOSIS — Z209 Contact with and (suspected) exposure to unspecified communicable disease: Secondary | ICD-10-CM

## 2014-02-07 LAB — POCT URINALYSIS DIPSTICK
Bilirubin, UA: NEGATIVE
Glucose, UA: NEGATIVE
Ketones, UA: NEGATIVE
Leukocytes, UA: NEGATIVE
NITRITE UA: NEGATIVE
PH UA: 6
Protein, UA: NEGATIVE
RBC UA: NEGATIVE
Urobilinogen, UA: NEGATIVE

## 2014-02-07 LAB — CBC WITH DIFFERENTIAL/PLATELET
BASOS ABS: 0 10*3/uL (ref 0.0–0.1)
BASOS PCT: 0 % (ref 0–1)
Eosinophils Absolute: 0.3 10*3/uL (ref 0.0–0.7)
Eosinophils Relative: 4 % (ref 0–5)
HEMATOCRIT: 38.1 % (ref 36.0–46.0)
Hemoglobin: 13.1 g/dL (ref 12.0–15.0)
Lymphocytes Relative: 46 % (ref 12–46)
Lymphs Abs: 3.1 10*3/uL (ref 0.7–4.0)
MCH: 30.4 pg (ref 26.0–34.0)
MCHC: 34.4 g/dL (ref 30.0–36.0)
MCV: 88.4 fL (ref 78.0–100.0)
MONOS PCT: 5 % (ref 3–12)
MPV: 8.6 fL — AB (ref 9.4–12.4)
Monocytes Absolute: 0.3 10*3/uL (ref 0.1–1.0)
NEUTROS ABS: 3.1 10*3/uL (ref 1.7–7.7)
NEUTROS PCT: 45 % (ref 43–77)
PLATELETS: 393 10*3/uL (ref 150–400)
RBC: 4.31 MIL/uL (ref 3.87–5.11)
RDW: 13.9 % (ref 11.5–15.5)
WBC: 6.8 10*3/uL (ref 4.0–10.5)

## 2014-02-07 LAB — POCT GLYCOSYLATED HEMOGLOBIN (HGB A1C): HEMOGLOBIN A1C: 5.7

## 2014-02-07 NOTE — Progress Notes (Signed)
Subjective:     Patient ID: Tamara Mckenzie, female   DOB: 03/22/1975, 38 y.o.   MRN: [id number]  HPI  This is a 38 year old female with a history of essential hypertension, chronic mixed headaches, allergic rhinitis, and morbid obesity presenting for a comprehensive physical examination.  Over the last 4 months ago she started noticing a dry mouth worst right after she brushes her teeth.  She also feels dehydrated, but she doesn't feel like she needs to go to the bathroom more frequently.  She has had some associated intermittent nausea with eating.  She has not had significant weight loss, night sweats, fevers, chills, polyuria, constipation, or dry hair.  Her blood pressure is well-controlled with atenolol.  She experiences tension-type headaches twice weekly and migraine headaches once per month.  Their frequency and severity have been unchanged over the last year.  She has been evaluated by a headache clinic who placed her on her current regimen of topiramate and amitryptiline.  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  Not eating right or exercising per her own admission.  Non-smoker and doesn't drink.  Support structure at home and safe in relationships.  She is sexually active with one new partner since her last visit. Ambien social history were reviewed. Health maintenance and immunizations were also reviewed  Review of Systems  All other systems reviewed and are negative.  Per HPI     Objective:   Physical Exam  BP 130/90 mmHg  Pulse 86  Ht 5\' 5"  (1.651 m)  Wt 294 lb (133.358 kg)  BMI 48.92 kg/m2  SpO2 98%  General Appearance:    Alert, cooperative, no distress, appears stated age  Head:    Normocephalic, without obvious abnormality, atraumatic  Eyes:    PERRL, conjunctiva/corneas clear, EOM's intact, fundi    benign, both eyes  Ears:    Normal TM's and external ear canals, both ears  Nose:    Nares normal, septum midline, mucosa normal, no drainage    or sinus tenderness  Throat:   Lips, mucosa, and tongue normal; teeth and gums normal  Neck:   Supple, symmetrical, trachea midline, no adenopathy;    thyroid:  no enlargement/tenderness/nodules; no carotid   bruit or JVD  Back:     Symmetric, no curvature, ROM normal, no CVA tenderness  Lungs:     Clear to auscultation bilaterally, respirations unlabored  Chest Wall:    No tenderness or deformity   Heart:    Regular rate and rhythm, S1 and S2 normal, no murmur, rub   or gallop  Breast Exam:    No tenderness, masses, or nipple abnormality  Abdomen:     Soft, non-tender, bowel sounds active all four quadrants,    no masses, no organomegaly  Extremities:   Extremities normal, atraumatic, no cyanosis or edema  Pulses:   2+ and symmetric all extremities  Skin:   Skin color, texture, turgor normal, no rashes or lesions  Lymph nodes:   Cervical, supraclavicular, and axillary nodes normal   2014 CBC, lipids, and CMP all WNL. A1C today 5.7.     Assessment & Plan:      Routine general medical examination at a health care facility - Plan: CBC with Differential, CMP and Liver, Lipid Panel, HgB A1c, POCT glycosylated hemoglobin (Hb A1C), POCT Urinalysis Dipstick  Essential hypertension, benign - Plan: Lipid Panel, POCT Urinalysis Dipstick  Other seasonal allergic rhinitis  Chronic nonintractable headache, unspecified headache type  Need for prophylactic vaccination and inoculation against influenza - Plan: Flu Vaccine QUAD 36+ mos PF IM (Fluarix Quad PF)  Morbid obesity  Xerostomia - Plan: HgB A1c  Chronic fatigue - Plan: HgB A1c  Contact with or exposure to communicable disease - Plan: HIV antibody (with reflex), RPR  Her xerostomia, polydypsia, and fatigue are suggestive of diabetes in the setting of her morbid obesity.  However, her A1C of 5.7 at today's visit is not consistent with T2DM.  She is also followed by her  gynecologist who performs her pap smears.  Aside from her flu shot which she will receive today, her preventive measures are up to date.  Her headaches, hypertension, and chronic rhinitis are all well controlled.  We evaluated her diet and exercise patterns today, but she is currently pre-contemplative about lifestyle modifications in light of her demanding school and work obligations.

## 2014-02-08 LAB — CMP AND LIVER
ALBUMIN: 3.8 g/dL (ref 3.5–5.2)
ALK PHOS: 90 U/L (ref 39–117)
ALT: 13 U/L (ref 0–35)
AST: 16 U/L (ref 0–37)
BILIRUBIN TOTAL: 0.2 mg/dL (ref 0.2–1.2)
BUN: 11 mg/dL (ref 6–23)
Bilirubin, Direct: 0.1 mg/dL (ref 0.0–0.3)
CO2: 25 mEq/L (ref 19–32)
Calcium: 9 mg/dL (ref 8.4–10.5)
Chloride: 105 mEq/L (ref 96–112)
Creat: 0.89 mg/dL (ref 0.50–1.10)
Glucose, Bld: 89 mg/dL (ref 70–99)
Indirect Bilirubin: 0.1 mg/dL — ABNORMAL LOW (ref 0.2–1.2)
Potassium: 3.8 mEq/L (ref 3.5–5.3)
SODIUM: 135 meq/L (ref 135–145)
TOTAL PROTEIN: 7.5 g/dL (ref 6.0–8.3)

## 2014-02-08 LAB — HIV ANTIBODY (ROUTINE TESTING W REFLEX): HIV 1&2 Ab, 4th Generation: NONREACTIVE

## 2014-02-08 LAB — LIPID PANEL
CHOL/HDL RATIO: 2.5 ratio
Cholesterol: 155 mg/dL (ref 0–200)
HDL: 61 mg/dL (ref >39)
LDL Cholesterol: 68 mg/dL (ref 0–99)
Triglycerides: 128 mg/dL (ref <150)
VLDL: 26 mg/dL (ref 0–40)

## 2014-02-08 LAB — RPR

## 2014-03-21 ENCOUNTER — Ambulatory Visit (INDEPENDENT_AMBULATORY_CARE_PROVIDER_SITE_OTHER): Payer: BLUE CROSS/BLUE SHIELD | Admitting: Family Medicine

## 2014-03-21 ENCOUNTER — Encounter: Payer: Self-pay | Admitting: Family Medicine

## 2014-03-21 VITALS — BP 130/84 | Temp 98.7°F | Wt 285.0 lb

## 2014-03-21 DIAGNOSIS — J029 Acute pharyngitis, unspecified: Secondary | ICD-10-CM

## 2014-03-21 DIAGNOSIS — J040 Acute laryngitis: Secondary | ICD-10-CM

## 2014-03-21 DIAGNOSIS — J039 Acute tonsillitis, unspecified: Secondary | ICD-10-CM

## 2014-03-21 LAB — POCT RAPID STREP A (OFFICE): Rapid Strep A Screen: NEGATIVE

## 2014-03-21 LAB — POCT MONO (EPSTEIN BARR VIRUS): Mono, POC: NEGATIVE

## 2014-03-21 MED ORDER — AMOXICILLIN 875 MG PO TABS: 875.0000 mg | ORAL_TABLET | Freq: Two times a day (BID) | ORAL | Status: DC

## 2014-03-21 NOTE — Patient Instructions (Addendum)
If the swelling and fullness gets worse on the left, let me know

## 2014-03-21 NOTE — Progress Notes (Signed)
   Subjective:    Patient ID: Tamara Mckenzie, female    DOB: [birth year], 39 y.o.   MRN: [id number]  HPI She complains of a 4 day history this started with sore throat followed by fever, chills, malaise, fatigue and now hoarse voice and slight coughing. She does state she notes more discomfort on the left.   Review of Systems     Objective:   Physical Exam alert and in no distress. Tympanic membranes and canals are normal.  Tonsils are red and swollen with exudates mainly on the left and the left tonsil does seem slightly more enlarged. Neck is supple without adenopathy or thyromegaly. No lateral adenopathy was noted. Cardiac exam shows a regular sinus rhythm without murmurs or gallops. Lungs are clear to auscultation. Abdominal exam shows no hepatosplenomegaly Strep and mono test negative       Assessment & Plan:  Sore throat - Plan: POCT rapid strep A, amoxicillin (AMOXIL) 875 MG tablet  Acute tonsillitis - Plan: Mono (Epstein Barr Virus), amoxicillin (AMOXIL) 875 MG tablet  Laryngitis - Plan: amoxicillin (AMOXIL) 875 MG tablet  with her left tonsil being slightly larger than the right, I will treat her as an early peritonsillar abscess and recommend she call me if the symptoms worsen especially on the left.

## 2014-03-29 LAB — HM PAP SMEAR

## 2014-05-04 ENCOUNTER — Other Ambulatory Visit: Payer: Self-pay | Admitting: Family Medicine

## 2014-06-11 LAB — LAB REPORT - SCANNED
HM HIV Screening: NEGATIVE
HM Hepatitis Screen: NEGATIVE

## 2014-06-30 ENCOUNTER — Other Ambulatory Visit: Payer: Self-pay | Admitting: Family Medicine

## 2014-06-30 NOTE — Telephone Encounter (Signed)
Is this okay to refill? 

## 2014-07-30 ENCOUNTER — Other Ambulatory Visit: Payer: Self-pay | Admitting: Family Medicine

## 2014-11-11 ENCOUNTER — Other Ambulatory Visit: Payer: Self-pay | Admitting: Family Medicine

## 2014-11-19 ENCOUNTER — Other Ambulatory Visit: Payer: Self-pay | Admitting: Family Medicine

## 2014-11-21 NOTE — Telephone Encounter (Signed)
Is this okay?

## 2014-12-06 ENCOUNTER — Encounter: Payer: Self-pay | Admitting: Family Medicine

## 2014-12-06 ENCOUNTER — Ambulatory Visit (INDEPENDENT_AMBULATORY_CARE_PROVIDER_SITE_OTHER): Payer: BLUE CROSS/BLUE SHIELD | Admitting: Family Medicine

## 2014-12-06 VITALS — BP 146/100 | HR 68 | Wt 291.4 lb

## 2014-12-06 DIAGNOSIS — R519 Headache, unspecified: Secondary | ICD-10-CM

## 2014-12-06 DIAGNOSIS — N910 Primary amenorrhea: Secondary | ICD-10-CM | POA: Diagnosis not present

## 2014-12-06 DIAGNOSIS — R51 Headache: Secondary | ICD-10-CM

## 2014-12-06 DIAGNOSIS — I10 Essential (primary) hypertension: Secondary | ICD-10-CM | POA: Diagnosis not present

## 2014-12-06 DIAGNOSIS — G8929 Other chronic pain: Secondary | ICD-10-CM

## 2014-12-06 DIAGNOSIS — N926 Irregular menstruation, unspecified: Secondary | ICD-10-CM

## 2014-12-06 LAB — POCT URINE PREGNANCY: Preg Test, Ur: NEGATIVE

## 2014-12-06 MED ORDER — HYDROCHLOROTHIAZIDE 12.5 MG PO CAPS: 12.5000 mg | ORAL_CAPSULE | Freq: Every day | ORAL | Status: DC

## 2014-12-06 NOTE — Patient Instructions (Addendum)
Continue taking all of your current medications and start taking hydrochlorothiazide daily. Continue checking your blood pressure daily for the next couple of weeks. Try switching to Excedrin for your headache and staying hydrated. Follow-up in 3-4 weeks and we'll see how your blood pressure is doing.    DASH Eating Plan DASH stands for "Dietary Approaches to Stop Hypertension." The DASH eating plan is a healthy eating plan that has been shown to reduce high blood pressure (hypertension). Additional health benefits may include reducing the risk of type 2 diabetes mellitus, heart disease, and stroke. The DASH eating plan may also help with weight loss. WHAT DO I NEED TO KNOW ABOUT THE DASH EATING PLAN? For the DASH eating plan, you will follow these general guidelines:  Choose foods with a percent daily value for sodium of less than 5% (as listed on the food label).  Use salt-free seasonings or herbs instead of table salt or sea salt.  Check with your health care provider or pharmacist before using salt substitutes.  Eat lower-sodium products, often labeled as "lower sodium" or "no salt added."  Eat fresh foods.  Eat more vegetables, fruits, and low-fat dairy products.  Choose whole grains. Look for the word "whole" as the first word in the ingredient list.  Choose fish and skinless chicken or [place] more often than red meat. Limit fish, poultry, and meat to 6 oz (170 g) each day.  Limit sweets, desserts, sugars, and sugary drinks.  Choose heart-healthy fats.  Limit cheese to 1 oz (28 g) per day.  Eat more home-cooked food and less restaurant, buffet, and fast food.  Limit fried foods.  Cook foods using methods other than frying.  Limit canned vegetables. If you do use them, rinse them well to decrease the sodium.  When eating at a restaurant, ask that your food be prepared with less salt, or no salt if possible. WHAT FOODS CAN I EAT? Seek help from a dietitian for individual  calorie needs. Grains Whole grain or whole wheat bread. Brown rice. Whole grain or whole wheat pasta. Quinoa, bulgur, and whole grain cereals. Low-sodium cereals. Corn or whole wheat flour tortillas. Whole grain cornbread. Whole grain crackers. Low-sodium crackers. Vegetables Fresh or frozen vegetables (raw, steamed, roasted, or grilled). Low-sodium or reduced-sodium tomato and vegetable juices. Low-sodium or reduced-sodium tomato sauce and paste. Low-sodium or reduced-sodium canned vegetables.  Fruits All fresh, canned (in natural juice), or frozen fruits. Meat and Other Protein Products Ground beef (85% or leaner), grass-fed beef, or beef trimmed of fat. Skinless chicken or [place]. Ground chicken or [place]. Pork trimmed of fat. All fish and seafood. Eggs. Dried beans, peas, or lentils. Unsalted nuts and seeds. Unsalted canned beans. Dairy Low-fat dairy products, such as skim or 1% milk, 2% or reduced-fat cheeses, low-fat ricotta or cottage cheese, or plain low-fat yogurt. Low-sodium or reduced-sodium cheeses. Fats and Oils Tub margarines without trans fats. Light or reduced-fat mayonnaise and salad dressings (reduced sodium). Avocado. Safflower, olive, or canola oils. Natural peanut or almond butter. Other Unsalted popcorn and pretzels. The items listed above may not be a complete list of recommended foods or beverages. Contact your dietitian for more options. WHAT FOODS ARE NOT RECOMMENDED? Grains White bread. White pasta. White rice. Refined cornbread. Bagels and croissants. Crackers that contain trans fat. Vegetables Creamed or fried vegetables. Vegetables in a cheese sauce. Regular canned vegetables. Regular canned tomato sauce and paste. Regular tomato and vegetable juices. Fruits Dried fruits. Canned fruit in light or heavy syrup.  Fruit juice. Meat and Other Protein Products Fatty cuts of meat. Ribs, chicken wings, bacon, sausage, bologna, salami, chitterlings, fatback, hot dogs,  bratwurst, and packaged luncheon meats. Salted nuts and seeds. Canned beans with salt. Dairy Whole or 2% milk, cream, half-and-half, and cream cheese. Whole-fat or sweetened yogurt. Full-fat cheeses or blue cheese. Nondairy creamers and whipped toppings. Processed cheese, cheese spreads, or cheese curds. Condiments Onion and garlic salt, seasoned salt, table salt, and sea salt. Canned and packaged gravies. Worcestershire sauce. Tartar sauce. Barbecue sauce. Teriyaki sauce. Soy sauce, including reduced sodium. Steak sauce. Fish sauce. Oyster sauce. Cocktail sauce. Horseradish. Ketchup and mustard. Meat flavorings and tenderizers. Bouillon cubes. Hot sauce. Tabasco sauce. Marinades. Taco seasonings. Relishes. Fats and Oils Butter, stick margarine, lard, shortening, ghee, and bacon fat. Coconut, palm kernel, or palm oils. Regular salad dressings. Other Pickles and olives. Salted popcorn and pretzels. The items listed above may not be a complete list of foods and beverages to avoid. Contact your dietitian for more information. WHERE CAN I FIND MORE INFORMATION? National Heart, Lung, and Blood Institute: [employer]   This information is not intended to replace advice given to you by your health care provider. Make sure you discuss any questions you have with your health care provider.   Document Released: 01/31/2011 Document Revised: 03/04/2014 Document Reviewed: 12/16/2012 Elsevier Interactive Patient Education [employer]

## 2014-12-06 NOTE — Progress Notes (Signed)
   Subjective:    Patient ID: Tamara Mckenzie, female    DOB: [birth year], 39 y.o.   MRN: [id number]  HPI  Chief Complaint  Patient presents with  . high bp, headaches    high bp for the last week., highest bp was 160/110. lowest bp was 129/84 headaches- constant. goody powder seems to help some but will come back 3-4 hours later. checked her bp monitor and got 134/95    She is 39 year old female with a history of chronic headaches and hypertension who is here for intermittent headaches and elevated blood pressure for past one and half weeks. States BP at home 160/110. States headaches are mostly in the evenings and describes them as a dull ache that starts at back of her neck and radiates to right side of her head with occasional pulsating. She is able to work and take care of her daily activities, is able to sleep at night. States she is taking Topamax and amitryptiline daily for headache prophylaxis and has been taking Goody powder for acute headaches with some relief but then headache returns about 3 hours later.  Reports eating a gluten free diet and being lactose intolerant. She added aloe vera juice and cayenne pepper to her diet about the same time as headaches started, she has been taking this to try and bring her blood pressure down.   Denies nausea, vomiting, photophobia, phonophobia. No known headache triggers. She states they are not related to her cycles. She reports OB/GYN diagnosed her with PCOS recently and had her taking birth control pills and she just stopped them last month, has not started her menstrual cycle and there is a chance she could be pregnant.   Reviewed allergies, medications, past medical and social history.  Past Medical History  Diagnosis Date  . Hypertension   . IBS (irritable bowel syndrome)   . Genital herpes   . Mood disorder (HCC)   . Chronic headache   . Insomnia   . Allergy       Review of Systems Pertinent positives and negatives in the history  of present illness.    Objective:   Physical Exam  Constitutional: She is oriented to person, place, and time. She appears well-developed and well-nourished. No distress.  Neurological: She is alert and oriented to person, place, and time. No cranial nerve deficit. Coordination normal.  Skin: Skin is warm and dry.  Psychiatric: She has a normal mood and affect. Her behavior is normal. Judgment and thought content normal.   BP 146/100 mmHg  Pulse 68  Wt 291 lb 6.4 oz (132.178 kg)  Urine pregnancy negative    Assessment & Plan:  Essential hypertension, benign  Chronic nonintractable headache, unspecified headache type  Late menses - Plan: POCT urine pregnancy   Discussed that she should stop taking herbal supplements and try switching to Excedrin instead of Goody powder for her headaches since this has worked for her in the past. Written instructions provided for DASH diet for blood pressure control and daily low dose of HCTZ prescribed. She will continue checking her blood pressure daily for the next couple of weeks. Follow-up in 3-4 weeks for blood pressure and headaches.  She will follow-up with her OB/GYN who had her taking oral contraception and diagnosed her with PCOS.

## 2014-12-07 ENCOUNTER — Ambulatory Visit: Payer: BLUE CROSS/BLUE SHIELD | Admitting: Medical

## 2015-02-16 ENCOUNTER — Other Ambulatory Visit: Payer: Self-pay | Admitting: Family Medicine

## 2015-03-18 ENCOUNTER — Other Ambulatory Visit: Payer: Self-pay | Admitting: Family Medicine

## 2015-06-01 ENCOUNTER — Telehealth: Payer: Self-pay | Admitting: Family Medicine

## 2015-06-01 NOTE — Telephone Encounter (Signed)
ok 

## 2015-06-01 NOTE — Telephone Encounter (Signed)
Is this okay to refill until CPE in May?

## 2015-06-01 NOTE — Telephone Encounter (Signed)
ALERT PT HAS A NEW PHARMACY/ Pt made a cpe appt for May needs refills on HCTZ and Valtrex sent [facility] [address] [place]

## 2015-06-02 ENCOUNTER — Other Ambulatory Visit: Payer: Self-pay | Admitting: Family Medicine

## 2015-06-02 MED ORDER — HYDROCHLOROTHIAZIDE 12.5 MG PO CAPS: ORAL_CAPSULE | ORAL | Status: DC

## 2015-06-02 MED ORDER — VALACYCLOVIR HCL 500 MG PO TABS: 500.0000 mg | ORAL_TABLET | Freq: Every day | ORAL | Status: DC

## 2015-06-02 NOTE — Telephone Encounter (Signed)
Valtrex and HCTZ sent to [facility]

## 2015-06-14 LAB — HM PAP SMEAR: HM Pap smear: NORMAL

## 2015-06-20 ENCOUNTER — Other Ambulatory Visit: Payer: Self-pay | Admitting: Family Medicine

## 2015-06-24 ENCOUNTER — Other Ambulatory Visit: Payer: Self-pay | Admitting: Family Medicine

## 2015-06-26 NOTE — Telephone Encounter (Signed)
Is this okay to refill? 

## 2015-06-29 ENCOUNTER — Encounter: Payer: BLUE CROSS/BLUE SHIELD | Admitting: Family Medicine

## 2015-06-30 LAB — LAB REPORT - SCANNED
A1c: 5.6
TSH: 0.93

## 2015-07-10 ENCOUNTER — Other Ambulatory Visit: Payer: Self-pay | Admitting: Family Medicine

## 2015-07-25 ENCOUNTER — Encounter: Payer: Self-pay | Admitting: Family Medicine

## 2015-07-25 ENCOUNTER — Ambulatory Visit (INDEPENDENT_AMBULATORY_CARE_PROVIDER_SITE_OTHER): Payer: BLUE CROSS/BLUE SHIELD | Admitting: Family Medicine

## 2015-07-25 VITALS — BP 130/88 | HR 70 | Ht 64.0 in | Wt 280.6 lb

## 2015-07-25 DIAGNOSIS — Z209 Contact with and (suspected) exposure to unspecified communicable disease: Secondary | ICD-10-CM | POA: Diagnosis not present

## 2015-07-25 DIAGNOSIS — E282 Polycystic ovarian syndrome: Secondary | ICD-10-CM | POA: Diagnosis not present

## 2015-07-25 DIAGNOSIS — Z23 Encounter for immunization: Secondary | ICD-10-CM | POA: Diagnosis not present

## 2015-07-25 DIAGNOSIS — E669 Obesity, unspecified: Secondary | ICD-10-CM | POA: Diagnosis not present

## 2015-07-25 DIAGNOSIS — K117 Disturbances of salivary secretion: Secondary | ICD-10-CM | POA: Diagnosis not present

## 2015-07-25 DIAGNOSIS — I10 Essential (primary) hypertension: Secondary | ICD-10-CM

## 2015-07-25 DIAGNOSIS — G43809 Other migraine, not intractable, without status migrainosus: Secondary | ICD-10-CM

## 2015-07-25 DIAGNOSIS — R5382 Chronic fatigue, unspecified: Secondary | ICD-10-CM | POA: Diagnosis not present

## 2015-07-25 DIAGNOSIS — J302 Other seasonal allergic rhinitis: Secondary | ICD-10-CM | POA: Diagnosis not present

## 2015-07-25 DIAGNOSIS — Z0000 Encounter for general adult medical examination without abnormal findings: Secondary | ICD-10-CM | POA: Diagnosis not present

## 2015-07-25 DIAGNOSIS — R682 Dry mouth, unspecified: Secondary | ICD-10-CM

## 2015-07-25 LAB — HIV ANTIBODY (ROUTINE TESTING W REFLEX): HIV: NONREACTIVE

## 2015-07-25 MED ORDER — ATENOLOL 50 MG PO TABS: 50.0000 mg | ORAL_TABLET | Freq: Every day | ORAL | Status: DC

## 2015-07-25 MED ORDER — HYDROCHLOROTHIAZIDE 12.5 MG PO CAPS: ORAL_CAPSULE | ORAL | Status: DC

## 2015-07-25 NOTE — Progress Notes (Signed)
Subjective:    Patient ID: Tamara Mckenzie, female    DOB: 04-22-1975, 40 y.o.   MRN: [id number]  HPI She is here for complete examination. She was recently seen by her gynecologist and is now being treated for PCO OS. She is on birth control pills as well as metformin. She also has a history of hypertension. Her allergies seem to be under good control. She continues on a mature totally for treatment of her migraine headaches. She usually has several days per month but they're handled well with OTC medications. She still has some difficulty with dry mouth. She does complain of some fatigue. She is interested in weight loss. She does have a very sedentary job. Family and social history as well as health maintenance and immunizations were reviewed.   Review of Systems  All other systems reviewed and are negative.      Objective:   Physical Exam BP 130/88 mmHg  Pulse 70  Ht  (1.626 m)  Wt 280 lb 9.6 oz (127.279 kg)  BMI 48.14 kg/m2  SpO2 98%  General Appearance:    Alert, cooperative, no distress, appears stated age  Head:    Normocephalic, without obvious abnormality, atraumatic  Eyes:    PERRL, conjunctiva/corneas clear, EOM's intact, fundi    benign  Ears:    Normal TM's and external ear canals  Nose:   Nares normal, mucosa normal, no drainage or sinus   tenderness  Throat:   Lips, mucosa, and tongue normal; teeth and gums normal  Neck:   Supple, no lymphadenopathy;  thyroid:  no   enlargement/tenderness/nodules; no carotid   bruit or JVD  Back:    Spine nontender, no curvature, ROM normal, no CVA     tenderness  Lungs:     Clear to auscultation bilaterally without wheezes, rales or     ronchi; respirations unlabored  Chest Wall:    No tenderness or deformity   Heart:    Regular rate and rhythm, S1 and S2 normal, no murmur, rub   or gallop  Breast Exam:    Deferred to GYN  Abdomen:     Soft, non-tender, nondistended, normoactive bowel sounds,    no masses, no  hepatosplenomegaly  Genitalia:    Deferred to GYN     Extremities:   No clubbing, cyanosis or edema  Pulses:   2+ and symmetric all extremities  Skin:   Skin color, texture, turgor normal, no rashes or lesions  Lymph nodes:   Cervical, supraclavicular, and axillary nodes normal  Neurologic:   CNII-XII intact, normal strength, sensation and gait; reflexes 2+ and symmetric throughout          Psych:   Normal mood, affect, hygiene and grooming.          Assessment & Plan:  Routine general medical examination at a health care facility  Need for prophylactic vaccination with combined diphtheria-tetanus-pertussis (DTP) vaccine - Plan: Tdap vaccine greater than or equal to 7yo IM  Essential hypertension, benign  Other seasonal allergic rhinitis  Migraine variant with headache  Obesity  Xerostomia  PCOS (polycystic ovarian syndrome)  Chronic fatigue  Contact with or exposure to communicable disease - Plan: HIV antibody [place] present medications. Discussed diet and exercise with her strongly encouraged her to get involved in an exercise program on a regular basis especially during her workdays. Also discussed making dietary modifications specifically cutting back on carbohydrates. Encouraged her to return here as needed for further discussion on this.  Discussed the benefit of weight loss for her blood pressure as well as PCO OS.

## 2015-07-25 NOTE — Patient Instructions (Signed)
Take 5 minutes out a morning break 10 minutes out ear lunch break and another 5 out her evening break and go for walk. Cut back on bread,rice,pasta and,potatoes.. Better on eating more frequent but smaller meals

## 2015-07-28 ENCOUNTER — Encounter: Payer: Self-pay | Admitting: Family Medicine

## 2015-07-31 NOTE — Progress Notes (Signed)
Order(s) created erroneously. Erroneous order ID: [id number]  Order moved by: Deatra CanterSMITH, Arihant Pennings M  Order move date/time: 07/31/2015 8:11 AM  Source Patient: [id number]  Source Contact: 07/28/2015  Destination Patient: [id number]  Destination Contact: 05/12/2012

## 2015-09-23 ENCOUNTER — Other Ambulatory Visit: Payer: Self-pay | Admitting: Family Medicine

## 2015-09-25 NOTE — Telephone Encounter (Signed)
Is this okay to refill? 

## 2015-10-19 ENCOUNTER — Other Ambulatory Visit: Payer: Self-pay | Admitting: Family Medicine

## 2015-12-09 ENCOUNTER — Other Ambulatory Visit: Payer: Self-pay | Admitting: Family Medicine

## 2015-12-11 NOTE — Telephone Encounter (Signed)
Is this okay to refill? 

## 2016-01-22 ENCOUNTER — Other Ambulatory Visit: Payer: Self-pay | Admitting: Family Medicine

## 2016-03-05 ENCOUNTER — Telehealth: Payer: Self-pay

## 2016-03-05 MED ORDER — VALACYCLOVIR HCL 500 MG PO TABS: 500.0000 mg | ORAL_TABLET | Freq: Every day | ORAL | 0 refills | Status: DC

## 2016-03-05 NOTE — Telephone Encounter (Signed)
Fax rcvd for 90 day supply refill of Valacyclovir. [facility] [address]

## 2016-03-05 NOTE — Telephone Encounter (Signed)
ok 

## 2016-03-05 NOTE — Telephone Encounter (Signed)
rx sent

## 2016-06-03 ENCOUNTER — Other Ambulatory Visit: Payer: Self-pay | Admitting: Family Medicine

## 2016-06-19 ENCOUNTER — Other Ambulatory Visit: Payer: Self-pay | Admitting: Family Medicine

## 2016-07-07 LAB — LAB REPORT - SCANNED
HM HIV Screening: NEGATIVE
HM Hepatitis Screen: NEGATIVE

## 2016-07-08 LAB — HM PAP SMEAR

## 2016-07-21 ENCOUNTER — Other Ambulatory Visit: Payer: Self-pay | Admitting: Family Medicine

## 2016-07-23 NOTE — Telephone Encounter (Signed)
Is this okay to refill? 

## 2016-07-26 ENCOUNTER — Encounter: Payer: BLUE CROSS/BLUE SHIELD | Admitting: Family Medicine

## 2016-08-02 ENCOUNTER — Ambulatory Visit (INDEPENDENT_AMBULATORY_CARE_PROVIDER_SITE_OTHER): Payer: BLUE CROSS/BLUE SHIELD | Admitting: Family Medicine

## 2016-08-02 ENCOUNTER — Encounter: Payer: Self-pay | Admitting: Family Medicine

## 2016-08-02 VITALS — BP 128/88 | HR 89 | Ht 64.0 in | Wt 268.0 lb

## 2016-08-02 DIAGNOSIS — B009 Herpesviral infection, unspecified: Secondary | ICD-10-CM

## 2016-08-02 DIAGNOSIS — E282 Polycystic ovarian syndrome: Secondary | ICD-10-CM

## 2016-08-02 DIAGNOSIS — I10 Essential (primary) hypertension: Secondary | ICD-10-CM | POA: Diagnosis not present

## 2016-08-02 DIAGNOSIS — Z0000 Encounter for general adult medical examination without abnormal findings: Secondary | ICD-10-CM | POA: Diagnosis not present

## 2016-08-02 DIAGNOSIS — J302 Other seasonal allergic rhinitis: Secondary | ICD-10-CM

## 2016-08-02 DIAGNOSIS — G43809 Other migraine, not intractable, without status migrainosus: Secondary | ICD-10-CM

## 2016-08-02 LAB — CBC WITH DIFFERENTIAL/PLATELET
BASOS ABS: 0 {cells}/uL (ref 0–200)
Basophils Relative: 0 %
EOS PCT: 2 %
Eosinophils Absolute: 112 cells/uL (ref 15–500)
HCT: 38.2 % (ref 35.0–45.0)
HEMOGLOBIN: 12.6 g/dL (ref 11.7–15.5)
LYMPHS ABS: 2632 {cells}/uL (ref 850–3900)
Lymphocytes Relative: 47 %
MCH: 29.1 pg (ref 27.0–33.0)
MCHC: 33 g/dL (ref 32.0–36.0)
MCV: 88.2 fL (ref 80.0–100.0)
MONOS PCT: 6 %
MPV: 8.3 fL (ref 7.5–12.5)
Monocytes Absolute: 336 cells/uL (ref 200–950)
NEUTROS PCT: 45 %
Neutro Abs: 2520 cells/uL (ref 1500–7800)
Platelets: 329 10*3/uL (ref 140–400)
RBC: 4.33 MIL/uL (ref 3.80–5.10)
RDW: 13.6 % (ref 11.0–15.0)
WBC: 5.6 10*3/uL (ref 4.0–10.5)

## 2016-08-02 LAB — POCT URINALYSIS DIPSTICK
Bilirubin, UA: NEGATIVE
Blood, UA: NEGATIVE
Glucose, UA: NEGATIVE
KETONES UA: NEGATIVE
Leukocytes, UA: NEGATIVE
Nitrite, UA: NEGATIVE
PH UA: 6 (ref 5.0–8.0)
PROTEIN UA: NEGATIVE
SPEC GRAV UA: 1.015 (ref 1.010–1.025)
Urobilinogen, UA: NEGATIVE E.U./dL — AB

## 2016-08-02 MED ORDER — HYDROCHLOROTHIAZIDE 12.5 MG PO CAPS: ORAL_CAPSULE | ORAL | 3 refills | Status: DC

## 2016-08-02 MED ORDER — ATENOLOL 50 MG PO TABS: 50.0000 mg | ORAL_TABLET | Freq: Every day | ORAL | 3 refills | Status: DC

## 2016-08-02 NOTE — Progress Notes (Signed)
Subjective:    Patient ID: Tamara Mckenzie, female    DOB: 1975-10-06, 41 y.o.   MRN: [id number]  HPI She is here for complete examination. Since August of last year she has become a vegan in hopes that this would help with her PCOS. Apparently her cycles are not regular. Presently she is taking metformin is being followed by her gynecologist for this. She also has an underlying history of migraine headaches but these are under very good control on her present amitriptyline and Topamax. Her blood pressure is being handled with atenolol and HCTZ. Of note is she has lost a few pounds since last being seen. She does have underlying allergies and has been using Flonase and occasional albuterol. She also states that she has a previous history of herpes but is questioning whether that was the correct diagnosis. She does not exercise regularly, does not smoke. Family and social history as well as health maintenance and immunizations were reviewed. She has no other concerns or questions.   Review of Systems  All other systems reviewed and are negative.      Objective:   Physical Exam BP 128/88   Pulse 89   Ht 5\' 4"  (1.626 m)   Wt 268 lb (121.6 kg)   SpO2 98%   BMI 46.00 kg/m   General Appearance:    Alert, cooperative, no distress, appears stated age  Head:    Normocephalic, without obvious abnormality, atraumatic  Eyes:    PERRL, conjunctiva/corneas clear, EOM's intact, fundi    benign  Ears:    Normal TM's and external ear canals  Nose:   Nares normal, mucosa normal, no drainage or sinus   tenderness  Throat:   Lips, mucosa, and tongue normal; teeth and gums normal  Neck:   Supple, no lymphadenopathy;  thyroid:  no   enlargement/tenderness/nodules; no carotid   bruit or JVD     Lungs:     Clear to auscultation bilaterally without wheezes, rales or     ronchi; respirations unlabored      Heart:    Regular rate and rhythm, S1 and S2 normal, no murmur, rub   or gallop  Breast Exam:     Deferred to GYN  Abdomen:     Soft, non-tender, nondistended, normoactive bowel sounds,    no masses, no hepatosplenomegaly  Genitalia:    Deferred to GYN     Extremities:   No clubbing, cyanosis or edema  Pulses:   2+ and symmetric all extremities  Skin:   Skin color, texture, turgor normal, no rashes or lesions  Lymph nodes:   Cervical, supraclavicular, and axillary nodes normal  Neurologic:   CNII-XII intact, normal strength, sensation and gait; reflexes 2+ and symmetric throughout          Psych:   Normal mood, affect, hygiene and grooming.          Assessment & Plan:  Routine general medical examination at a health care facility - Plan: POCT Urinalysis Dipstick, CBC with Differential/Platelet, Comprehensive metabolic panel, Lipid panel, CANCELED: POCT Urinalysis DIP (Proadvantage Device)  Essential hypertension, benign - Plan: CBC with Differential/Platelet, Comprehensive metabolic panel, hydrochlorothiazide (MICROZIDE) 12.5 MG capsule, atenolol (TENORMIN) 50 MG tablet  Other seasonal allergic rhinitis  Migraine variant with headache  PCOS (polycystic ovarian syndrome) - Plan: CBC with Differential/Platelet, Comprehensive metabolic panel  Morbid obesity (HCC)  Herpes infection - Plan: HSV(herpes simplex vrs) 1+2 ab-IgG She is interested in stopping some of these medications however explained  to her that the metformin is working quite nicely for her PCOS and would not recommend stopping that. Discussed more weight loss in regard to helping with her hypertension and overall health. Encouraged her to increase her physical activities by 20 minutes every day. Discussed taking 5 minutes with each morning and afternoon break and 10 minutes at lunch to go out for walk. Encouraged her to continue with her vegan diet. She will continue on her migraine medications.

## 2016-08-02 NOTE — Patient Instructions (Signed)
20 minutes daily of something physical. Take 5 minutes of your morning break and 5 minutes every afternoon break and 10 minutes of your lunch break and go for walk

## 2016-08-03 LAB — COMPREHENSIVE METABOLIC PANEL
ALT: 21 U/L (ref 6–29)
AST: 19 U/L (ref 10–30)
Albumin: 3.9 g/dL (ref 3.6–5.1)
Alkaline Phosphatase: 78 U/L (ref 33–115)
BUN: 3 mg/dL — AB (ref 7–25)
CHLORIDE: 106 mmol/L (ref 98–110)
CO2: 23 mmol/L (ref 20–31)
CREATININE: 0.7 mg/dL (ref 0.50–1.10)
Calcium: 8.9 mg/dL (ref 8.6–10.2)
GLUCOSE: 80 mg/dL (ref 65–99)
Potassium: 3.8 mmol/L (ref 3.5–5.3)
SODIUM: 138 mmol/L (ref 135–146)
TOTAL PROTEIN: 7 g/dL (ref 6.1–8.1)
Total Bilirubin: 0.5 mg/dL (ref 0.2–1.2)

## 2016-08-03 LAB — LIPID PANEL
CHOL/HDL RATIO: 2.4 ratio (ref <5.0)
Cholesterol: 182 mg/dL (ref <200)
HDL: 75 mg/dL (ref >50)
LDL CALC: 92 mg/dL (ref <100)
Triglycerides: 74 mg/dL (ref <150)
VLDL: 15 mg/dL (ref <30)

## 2016-08-05 LAB — HSV(HERPES SIMPLEX VRS) I + II AB-IGG

## 2016-09-16 ENCOUNTER — Other Ambulatory Visit: Payer: Self-pay | Admitting: Family Medicine

## 2016-09-16 DIAGNOSIS — I10 Essential (primary) hypertension: Secondary | ICD-10-CM

## 2016-09-17 ENCOUNTER — Other Ambulatory Visit: Payer: Self-pay | Admitting: Family Medicine

## 2016-09-17 NOTE — Telephone Encounter (Signed)
Pt was just seen for cpe back in june

## 2016-12-16 ENCOUNTER — Other Ambulatory Visit: Payer: Self-pay | Admitting: Family Medicine

## 2017-01-31 ENCOUNTER — Other Ambulatory Visit: Payer: Self-pay | Admitting: Family Medicine

## 2017-01-31 NOTE — Telephone Encounter (Signed)
Please advise on this medication refill.... Thank you

## 2017-07-28 ENCOUNTER — Other Ambulatory Visit: Payer: Self-pay | Admitting: Family Medicine

## 2017-07-28 NOTE — Telephone Encounter (Signed)
[facility]

## 2017-08-20 ENCOUNTER — Telehealth: Payer: Self-pay | Admitting: Family Medicine

## 2017-08-20 MED ORDER — AMITRIPTYLINE HCL 25 MG PO TABS: 25.0000 mg | ORAL_TABLET | Freq: Every day | ORAL | 3 refills | Status: DC

## 2017-08-20 NOTE — Telephone Encounter (Signed)
Pt left message [phone] @ [address] [place] [phone]

## 2018-07-27 ENCOUNTER — Other Ambulatory Visit: Payer: Self-pay

## 2018-07-27 ENCOUNTER — Ambulatory Visit (INDEPENDENT_AMBULATORY_CARE_PROVIDER_SITE_OTHER): Payer: 59 | Admitting: Family Medicine

## 2018-07-27 ENCOUNTER — Encounter: Payer: Self-pay | Admitting: Family Medicine

## 2018-07-27 VITALS — Temp 98.1°F | Wt 282.0 lb

## 2018-07-27 DIAGNOSIS — E282 Polycystic ovarian syndrome: Secondary | ICD-10-CM

## 2018-07-27 MED ORDER — METFORMIN HCL ER 500 MG PO TB24: 500.0000 mg | ORAL_TABLET | Freq: Every day | ORAL | 3 refills | Status: DC

## 2018-07-27 MED ORDER — EFLORNITHINE HCL 13.9 % EX CREA: 1.0000 "application " | TOPICAL_CREAM | Freq: Two times a day (BID) | CUTANEOUS | 5 refills | Status: DC

## 2018-07-27 NOTE — Progress Notes (Signed)
   Subjective:    Patient ID: Tamara Mckenzie, female    DOB: Nov 29, 1975, 43 y.o.   MRN: [id number]  HPI Documentation for virtual telephone encounter.  Documentation for virtual audio and video telecommunications through doximity encounter:  The patient was located at home. The provider was located in the office. The patient did consent to this visit and is aware of possible charges through their insurance for this visit.  The other persons participating in this telemedicine service were none. Time spent on call was 10 minutes and in review of previous records >15 minutes total.  This virtual service is not related to other E/M service within previous 7 days. She is interested in getting placed back on Vaniqa that she is using for facial hair.  She does have an underlying history of PCOS.  She has seen Dr. Henderson Cloud in the past for that.  She is supposed to be on metformin but stopped it on her own for no good reason.  She was having difficulty with amenorrhea and was recently placed back on her birth control.   Review of Systems     Objective:   Physical Exam Alert and in no distress otherwise not examined      Assessment & Plan:  PCOS (polycystic ovarian syndrome) - Plan: Eflornithine HCl 13.9 % cream, metFORMIN (GLUCOPHAGE-XR) 500 MG 24 hr tablet I will place her back on the Advair and equal and metformin.  Encouraged her to follow-up with Dr. Henderson Cloud within the next several months.  She plans to do that.

## 2018-09-04 ENCOUNTER — Telehealth: Payer: Self-pay | Admitting: Family Medicine

## 2018-09-04 MED ORDER — AMITRIPTYLINE HCL 25 MG PO TABS: 25.0000 mg | ORAL_TABLET | Freq: Every day | ORAL | 3 refills | Status: DC

## 2018-09-04 NOTE — Telephone Encounter (Signed)
Pt requested [facility]yline [facility]

## 2018-11-05 ENCOUNTER — Other Ambulatory Visit: Payer: Self-pay

## 2018-11-05 ENCOUNTER — Ambulatory Visit: Payer: 59 | Admitting: Family Medicine

## 2018-11-05 VITALS — BP 146/94 | Temp 97.6°F | Wt 280.0 lb

## 2018-11-05 DIAGNOSIS — G43809 Other migraine, not intractable, without status migrainosus: Secondary | ICD-10-CM

## 2018-11-05 DIAGNOSIS — I10 Essential (primary) hypertension: Secondary | ICD-10-CM

## 2018-11-05 MED ORDER — TOPIRAMATE 25 MG PO TABS: ORAL_TABLET | ORAL | 1 refills | Status: DC

## 2018-11-05 NOTE — Progress Notes (Signed)
   Subjective:    Patient ID: Flavia Shipper, female    DOB: 04/06/1975, 43 y.o.   MRN: [id number]  HPI Documentation for virtual telephone encounter. Documentation for virtual audio and video telecommunications through WebEx encounter: The provider was located in the office. The patient did consent to this visit and is aware of possible charges through their insurance for this visit. The other persons participating in this telemedicine service were none. Time spent on call was 15 minutes total. This virtual service is not related to other E/M service within previous 7 days. She stopped taking her Topamax approximately 2 years ago because her migraines seem to be under good control.  She also stopped her atenolol at the same time.  Over the last 2 weeks she has noted return of her headaches occurring every other day with a regular headache on the ensuing days.  The migraine is right-sided and throbbing.  She has tried Excedrin with some minimal relief of her symptoms.  She would like to get back on Topamax. Also recently she was seen in an urgent care center.  Apparently her hemoglobin A1c was 5.2 and her blood pressure was elevated.  She started taking her atenolol again. Also of concern is the fact that she is scheduled for bariatric surgery consult September 21.   Review of Systems     Objective:   Physical Exam Alert and in no distress otherwise not examined       Assessment & Plan:  Migraine variant with headache - Plan: topiramate (TOPAMAX) 25 MG tablet  Morbid obesity (HCC)  Essential hypertension, benign I will place her back on Topamax at 25/day which is what has worked in the past.  She has also use 2 Aleve twice per day.  Recommend she follow-up with an office visit here in 2 weeks and bring her blood pressure cuff.

## 2018-11-23 ENCOUNTER — Ambulatory Visit: Payer: 59 | Admitting: Family Medicine

## 2018-11-23 ENCOUNTER — Encounter: Payer: Self-pay | Admitting: Family Medicine

## 2018-11-23 ENCOUNTER — Other Ambulatory Visit: Payer: Self-pay

## 2018-11-23 VITALS — BP 136/91 | HR 82 | Temp 97.1°F | Wt 286.0 lb

## 2018-11-23 DIAGNOSIS — M25562 Pain in left knee: Secondary | ICD-10-CM

## 2018-11-23 DIAGNOSIS — G43809 Other migraine, not intractable, without status migrainosus: Secondary | ICD-10-CM | POA: Diagnosis not present

## 2018-11-23 DIAGNOSIS — I10 Essential (primary) hypertension: Secondary | ICD-10-CM | POA: Diagnosis not present

## 2018-11-23 DIAGNOSIS — M65331 Trigger finger, right middle finger: Secondary | ICD-10-CM | POA: Diagnosis not present

## 2018-11-23 MED ORDER — SUMATRIPTAN SUCCINATE 100 MG PO TABS: ORAL_TABLET | ORAL | 1 refills | Status: DC

## 2018-11-23 MED ORDER — TOPIRAMATE 50 MG PO TABS: 50.0000 mg | ORAL_TABLET | Freq: Two times a day (BID) | ORAL | 1 refills | Status: DC

## 2018-11-23 MED ORDER — ATENOLOL 50 MG PO TABS: 50.0000 mg | ORAL_TABLET | Freq: Every day | ORAL | 3 refills | Status: DC

## 2018-11-23 NOTE — Progress Notes (Signed)
   Subjective:    Patient ID: Tamara Mckenzie, female    DOB: 12-03-1975, 43 y.o.   MRN: [id number]  HPI She is here for a follow-up visit.  She presently is taking a atenolol and Topamax.  She did bring her blood pressure cuff in with her.  She also complains of left knee pain with physical activity and also right third finger popping sensation especially with attempting to try and extend her fingers.  She does work with her hands a lot.  She is also scheduled for bariatric surgery beginning of the year.   Review of Systems     Objective:   Physical Exam Alert and in no distress.  Blood pressure is recorded.  Her machine is low by about 7 points.  Exam of the right hand shows good motion with no crepitus and normal strength.  No joint swelling noted.  Exam of the left knee shows no effusion, tenderness palpation.  Medial and lateral collateral ligaments intact.  McMurray's testing negative.       Assessment & Plan:  Migraine variant with headache - Plan: SUMAtriptan (IMITREX) 100 MG tablet, topiramate (TOPAMAX) 50 MG tablet    I will add Imitrex to her regimen and also recommend use of Aleve.  Increase the Topamax to 50 mg.  She is to call me let me know how she is doing on that.  Essential hypertension, benign - Plan: atenolol (TENORMIN) 50 MG tablet    Explained that after she has her surgery, we could potentially readjust her     blood pressure medications. Morbid obesity (HCC)  Trigger middle finger of right hand    I discussed the treatment of trigger finger with her in regard to possible         injection however at this time she would like to hold off on that. Left knee pain, unspecified chronicity    Recommend conservative care for this again expressing the fact that if she loses weight, the knee pain should get better. Check your blood pressure no more often than once a week at least 7 points onto a to get an accurate reading.  Increase the Topamax to 50.  Leave a Message on My  Chart in about a month and let me know how your blood pressure and your migraines are doing.  I will call in a medication called Imitrex and you can take it twice in 1 day if you need to and also use Aleve.

## 2018-11-23 NOTE — Patient Instructions (Addendum)
Check your blood pressure no more often than once a week at least 7 points onto a to get an accurate reading.  Increase the Topamax to 50.  Leave a Message on My Chart in about a month and let me know how your blood pressure and your migraines are doing.  I will call in a medication called Imitrex and you can take it twice in 1 day if you need to and also use Aleve.

## 2018-11-25 ENCOUNTER — Other Ambulatory Visit: Payer: Self-pay | Admitting: Obstetrics and Gynecology

## 2018-11-25 DIAGNOSIS — Z1231 Encounter for screening mammogram for malignant neoplasm of breast: Secondary | ICD-10-CM

## 2018-11-30 ENCOUNTER — Other Ambulatory Visit: Payer: Self-pay | Admitting: Family Medicine

## 2018-11-30 DIAGNOSIS — G43809 Other migraine, not intractable, without status migrainosus: Secondary | ICD-10-CM

## 2018-11-30 NOTE — Telephone Encounter (Signed)
[facility] . It was last fill 11-23-18. Please advise . [place]

## 2018-12-25 ENCOUNTER — Ambulatory Visit: Payer: 59 | Admitting: Family Medicine

## 2018-12-31 LAB — HM PAP SMEAR

## 2019-01-20 DIAGNOSIS — K219 Gastro-esophageal reflux disease without esophagitis: Secondary | ICD-10-CM | POA: Insufficient documentation

## 2019-02-05 ENCOUNTER — Other Ambulatory Visit: Payer: Self-pay | Admitting: Family Medicine

## 2019-02-05 DIAGNOSIS — G43809 Other migraine, not intractable, without status migrainosus: Secondary | ICD-10-CM

## 2019-02-17 ENCOUNTER — Other Ambulatory Visit: Payer: Self-pay | Admitting: Family Medicine

## 2019-02-17 DIAGNOSIS — G43809 Other migraine, not intractable, without status migrainosus: Secondary | ICD-10-CM

## 2019-02-17 NOTE — Telephone Encounter (Signed)
[facility] sumatriptan. Please advise . [place]

## 2019-02-22 ENCOUNTER — Other Ambulatory Visit: Payer: Self-pay

## 2019-02-22 ENCOUNTER — Ambulatory Visit
Admission: RE | Admit: 2019-02-22 | Discharge: 2019-02-22 | Disposition: A | Discharge status: Home / Self Care | Payer: 59 | Source: Ambulatory Visit | Attending: Obstetrics and Gynecology | Admitting: Obstetrics and Gynecology

## 2019-02-22 DIAGNOSIS — Z1231 Encounter for screening mammogram for malignant neoplasm of breast: Secondary | ICD-10-CM

## 2019-03-01 ENCOUNTER — Other Ambulatory Visit: Payer: Self-pay | Admitting: Family Medicine

## 2019-03-01 DIAGNOSIS — G43809 Other migraine, not intractable, without status migrainosus: Secondary | ICD-10-CM

## 2019-03-01 NOTE — Telephone Encounter (Signed)
[facility] topamax. Please advise. [facility] 

## 2019-03-01 NOTE — Telephone Encounter (Signed)
[facility] 90 supply. Please advise

## 2019-03-25 ENCOUNTER — Encounter: Payer: Self-pay | Admitting: Family Medicine

## 2019-04-06 ENCOUNTER — Encounter: Payer: Self-pay | Admitting: Family Medicine

## 2019-04-06 ENCOUNTER — Ambulatory Visit: Payer: 59 | Admitting: Family Medicine

## 2019-04-06 ENCOUNTER — Other Ambulatory Visit: Payer: Self-pay

## 2019-04-06 VITALS — BP 129/88 | HR 69 | Wt 286.0 lb

## 2019-04-06 DIAGNOSIS — G43809 Other migraine, not intractable, without status migrainosus: Secondary | ICD-10-CM | POA: Diagnosis not present

## 2019-04-06 MED ORDER — UBRELVY 50 MG PO TABS: 1.0000 | ORAL_TABLET | ORAL | 0 refills | Status: DC

## 2019-04-06 NOTE — Progress Notes (Signed)
   Subjective:    Patient ID: Tamara Mckenzie, female    DOB: 1975/10/14, 44 y.o.   MRN: [id number]  HPI Documentation for virtual telephone encounter. Documentation for virtual audio and video telecommunications through Doximity encounter: The patient was located at home. The provider was located in the office. The patient did consent to this visit and is aware of possible charges through their insurance for this visit. The other persons participating in this telemedicine service were none. This virtual service is not related to other E/M service within previous 7 days. She called concerning migraines.  Her normal pattern is 1 or 2 migraines every 3 weeks that usually responds nicely to sumatriptan.  She does have difficulty with sumatriptan causing dizziness and some nausea.  This particular headache has not gone away.  There are no other precipitating factors that might of contributed to this.     Review of Systems     Objective:   Physical Exam Alert and in no distress otherwise not examined       Assessment & Plan:  Migraine variant with headache - Plan: Ubrogepant (UBRELVY) 50 MG TABS Explained the use of this medication.  She will call me tomorrow to let me know how she is doing.  Explained that we can give her a higher dose if we need to.  She expressed understanding.

## 2019-04-07 ENCOUNTER — Telehealth: Payer: Self-pay | Admitting: Family Medicine

## 2019-04-07 NOTE — Telephone Encounter (Signed)
Pt. Aware to pick up samples

## 2019-04-07 NOTE — Telephone Encounter (Signed)
Let her know that I will leave them at the front desk

## 2019-04-07 NOTE — Telephone Encounter (Signed)
Pt called and said the 50mg  samples of the Ubrelvy helped but wanted to know if we had any samples of the 100mg  to try. She feels like that will be better

## 2019-04-20 ENCOUNTER — Other Ambulatory Visit: Payer: Self-pay | Admitting: Family Medicine

## 2019-04-20 DIAGNOSIS — G43809 Other migraine, not intractable, without status migrainosus: Secondary | ICD-10-CM

## 2019-04-20 NOTE — Telephone Encounter (Signed)
[facility] topamax. Please advise. [facility] 

## 2019-04-26 ENCOUNTER — Other Ambulatory Visit: Payer: Self-pay | Admitting: Family Medicine

## 2019-04-26 DIAGNOSIS — G43809 Other migraine, not intractable, without status migrainosus: Secondary | ICD-10-CM

## 2019-04-26 NOTE — Telephone Encounter (Signed)
[facility] top[facility] . Please advise [facility]

## 2019-04-29 ENCOUNTER — Telehealth: Payer: Self-pay | Admitting: Family Medicine

## 2019-04-29 DIAGNOSIS — G43809 Other migraine, not intractable, without status migrainosus: Secondary | ICD-10-CM

## 2019-04-29 MED ORDER — UBRELVY 50 MG PO TABS: 1.0000 | ORAL_TABLET | ORAL | 1 refills | Status: DC

## 2019-04-29 NOTE — Addendum Note (Signed)
Addended by: Ronnald Nian on: 04/29/2019 01:12 PM   Modules accepted: Orders

## 2019-04-29 NOTE — Telephone Encounter (Signed)
Please advise when margarine med is called in. Thank you. KH see Olegario Messier message below.

## 2019-04-29 NOTE — Telephone Encounter (Signed)
Pt called for a refill or samples of Ubrelvy. She has a migraine coming on and at her last visit she was given samples and it work well. Pt can be reached at [phone] and [facility] [place]

## 2019-04-30 ENCOUNTER — Other Ambulatory Visit: Payer: Self-pay

## 2019-04-30 DIAGNOSIS — G43809 Other migraine, not intractable, without status migrainosus: Secondary | ICD-10-CM

## 2019-04-30 MED ORDER — UBRELVY 50 MG PO TABS: 1.0000 | ORAL_TABLET | ORAL | 1 refills | Status: DC

## 2019-05-05 ENCOUNTER — Telehealth: Payer: Self-pay | Admitting: Family Medicine

## 2019-05-05 ENCOUNTER — Telehealth: Payer: Self-pay

## 2019-05-05 DIAGNOSIS — G43809 Other migraine, not intractable, without status migrainosus: Secondary | ICD-10-CM

## 2019-05-05 MED ORDER — UBRELVY 50 MG PO TABS: 1.0000 | ORAL_TABLET | ORAL | 1 refills | Status: DC

## 2019-05-05 NOTE — Telephone Encounter (Signed)
Pt called and needs refill on [facility] [address]

## 2019-05-05 NOTE — Telephone Encounter (Signed)
I submitted a PA for the pts. Tamara Mckenzie and was approved by the insurance company from 05/05/19-05/04/20.

## 2019-05-10 DIAGNOSIS — Z9884 Bariatric surgery status: Secondary | ICD-10-CM | POA: Insufficient documentation

## 2019-05-10 HISTORY — PX: OTHER SURGICAL HISTORY: SHX169

## 2019-05-12 MED ORDER — OXYCODONE HCL 5 MG/5ML PO SOLN
5.00 | ORAL | Status: DC
Start: ? — End: 2019-05-12

## 2019-05-12 MED ORDER — LACTATED RINGERS IV SOLN
1000.00 | INTRAVENOUS | Status: DC
Start: ? — End: 2019-05-12

## 2019-05-12 MED ORDER — AMITRIPTYLINE HCL 25 MG PO TABS
25.00 | ORAL_TABLET | ORAL | Status: DC
Start: 2019-05-12 — End: 2019-05-12

## 2019-05-12 MED ORDER — ENOXAPARIN SODIUM 40 MG/0.4ML ~~LOC~~ SOLN
40.00 | SUBCUTANEOUS | Status: DC
Start: 2019-05-12 — End: 2019-05-12

## 2019-05-12 MED ORDER — TOPIRAMATE 25 MG PO TABS
50.00 | ORAL_TABLET | ORAL | Status: DC
Start: 2019-05-13 — End: 2019-05-12

## 2019-05-12 MED ORDER — UBROGEPANT 50 MG PO TABS
50.00 | ORAL_TABLET | ORAL | Status: DC
Start: ? — End: 2019-05-12

## 2019-05-12 MED ORDER — GENERIC EXTERNAL MEDICATION
1.00 | Status: DC
Start: ? — End: 2019-05-12

## 2019-05-12 MED ORDER — LABETALOL HCL 5 MG/ML IV SOLN
10.00 | INTRAVENOUS | Status: DC
Start: ? — End: 2019-05-12

## 2019-05-12 MED ORDER — NALOXONE HCL 0.4 MG/ML IJ SOLN
0.10 | INTRAMUSCULAR | Status: DC
Start: ? — End: 2019-05-12

## 2019-05-12 MED ORDER — PROCHLORPERAZINE EDISYLATE 10 MG/2ML IJ SOLN
10.00 | INTRAMUSCULAR | Status: DC
Start: ? — End: 2019-05-12

## 2019-05-12 MED ORDER — ONDANSETRON HCL 4 MG/2ML IJ SOLN
4.00 | INTRAMUSCULAR | Status: DC
Start: ? — End: 2019-05-12

## 2019-05-12 MED ORDER — METOPROLOL TARTRATE 25 MG PO TABS
25.00 | ORAL_TABLET | ORAL | Status: DC
Start: 2019-05-12 — End: 2019-05-12

## 2019-05-12 MED ORDER — PANTOPRAZOLE SODIUM 40 MG PO TBEC
40.00 | DELAYED_RELEASE_TABLET | ORAL | Status: DC
Start: 2019-05-13 — End: 2019-05-12

## 2019-06-10 ENCOUNTER — Other Ambulatory Visit: Payer: Self-pay | Admitting: Family Medicine

## 2019-06-10 DIAGNOSIS — G43809 Other migraine, not intractable, without status migrainosus: Secondary | ICD-10-CM

## 2019-06-11 NOTE — Telephone Encounter (Signed)
[facility] ubrogepant. Please advise [facility]

## 2019-06-21 ENCOUNTER — Telehealth: Payer: Self-pay | Admitting: Family Medicine

## 2019-06-21 NOTE — Telephone Encounter (Signed)
Pt coming in Friday for fatigue and want to get labs drawn to check thyroid

## 2019-06-25 ENCOUNTER — Other Ambulatory Visit: Payer: Self-pay

## 2019-06-25 ENCOUNTER — Ambulatory Visit: Payer: 59 | Admitting: Family Medicine

## 2019-06-25 ENCOUNTER — Encounter: Payer: Self-pay | Admitting: Family Medicine

## 2019-06-25 VITALS — BP 140/90 | HR 101 | Temp 97.8°F | Wt 254.8 lb

## 2019-06-25 DIAGNOSIS — Z9889 Other specified postprocedural states: Secondary | ICD-10-CM

## 2019-06-25 DIAGNOSIS — R5383 Other fatigue: Secondary | ICD-10-CM

## 2019-06-25 DIAGNOSIS — R0600 Dyspnea, unspecified: Secondary | ICD-10-CM

## 2019-06-25 DIAGNOSIS — R0609 Other forms of dyspnea: Secondary | ICD-10-CM

## 2019-06-25 NOTE — Progress Notes (Signed)
   Subjective:    Patient ID: Tamara Mckenzie, female    DOB: [birth year], 44 y.o.   MRN: [id number]  HPI She is here for evaluation of fatigue.  She had gastric surgery on March 15 and did have follow-up evaluation on April 14.  She states that since the surgery she has had difficulty with fatigue but also notes dyspnea on exertion stating when she walks up a flight of steps she becomes short of breath.  She did not have this symptom before the surgery.  No chest pain, PND.  She also complains of some slight cold intolerance and skin changes.  She did bring the blood work in from her visit on April 14.  It did show a low vitamin D as well as phosphorus.  She is now on a multivitamin as well as extra vitamin D 5000 units.   Review of Systems     Objective:   Physical Exam Alert and in no distress.  Cardiac exam does show a heart rate of around 100 but no murmurs or gallops.  Lungs are clear to auscultation.  EKG read by me shows a rate of 94 with other parameters being within the normal range.     Assessment & Plan:  Dyspnea on exertion - Plan: EKG 12-Lead  Other fatigue - Plan: TSH  S/P gastric surgery Recommend taking extra magnesium, continue with vitamin D and multivitamin.  Might possibly consider getting an echocardiogram to rule out cardiac cause of her shortness of breath.

## 2019-06-26 LAB — TSH: TSH: 2.33 u[IU]/mL (ref 0.450–4.500)

## 2019-06-26 NOTE — Addendum Note (Signed)
Addended by: Ronnald Nian on: 06/26/2019 09:58 AM   Modules accepted: Orders

## 2019-06-28 ENCOUNTER — Telehealth: Payer: Self-pay

## 2019-06-28 NOTE — Telephone Encounter (Signed)
Pt was advise of Echo scheduled for 07-06-19 . [facility]

## 2019-06-30 ENCOUNTER — Telehealth: Payer: Self-pay

## 2019-06-30 NOTE — Telephone Encounter (Signed)
I called the pt. And let her know that you could not fill out the [facility] paper work we received because we have to have a diagnosis to fill out the paper work. I told her that she has a scheduled echo on 07/06/19 and we would wait to see back for those results.

## 2019-07-06 ENCOUNTER — Telehealth: Payer: Self-pay

## 2019-07-06 ENCOUNTER — Other Ambulatory Visit: Payer: Self-pay

## 2019-07-06 ENCOUNTER — Ambulatory Visit (HOSPITAL_COMMUNITY): Payer: 59 | Attending: Cardiovascular Disease

## 2019-07-06 DIAGNOSIS — R0600 Dyspnea, unspecified: Secondary | ICD-10-CM | POA: Diagnosis not present

## 2019-07-06 DIAGNOSIS — R0609 Other forms of dyspnea: Secondary | ICD-10-CM

## 2019-07-06 NOTE — Telephone Encounter (Signed)
Pt was advised of PFT appt Pt was also advised of rules to follow prior to having the test preformed. Tamara Mckenzie

## 2019-07-07 MED ORDER — GLUCAGON HCL RDNA (DIAGNOSTIC) 1 MG IJ SOLR
INTRAMUSCULAR | Status: AC
  Filled 2019-07-07: qty 1

## 2019-07-07 MED ORDER — CEFAZOLIN SODIUM-DEXTROSE 2-4 GM/100ML-% IV SOLN
INTRAVENOUS | Status: AC
  Filled 2019-07-07: qty 100

## 2019-07-19 ENCOUNTER — Other Ambulatory Visit: Payer: Self-pay | Admitting: Family Medicine

## 2019-07-19 NOTE — Telephone Encounter (Signed)
[facility] amitriptyline. Please advise [facility] 

## 2019-08-04 ENCOUNTER — Other Ambulatory Visit: Payer: Self-pay | Admitting: Family Medicine

## 2019-08-04 DIAGNOSIS — G43809 Other migraine, not intractable, without status migrainosus: Secondary | ICD-10-CM

## 2019-08-04 NOTE — Telephone Encounter (Signed)
[facility] is [facility] pt Ubrelvy. Please advise [facility]

## 2019-08-14 ENCOUNTER — Other Ambulatory Visit (HOSPITAL_COMMUNITY)
Admission: RE | Admit: 2019-08-14 | Discharge: 2019-08-14 | Disposition: A | Discharge status: Home / Self Care | Payer: 59 | Source: Ambulatory Visit | Attending: Family Medicine | Admitting: Family Medicine

## 2019-08-14 DIAGNOSIS — Z20822 Contact with and (suspected) exposure to covid-19: Secondary | ICD-10-CM | POA: Insufficient documentation

## 2019-08-14 DIAGNOSIS — Z01812 Encounter for preprocedural laboratory examination: Secondary | ICD-10-CM | POA: Diagnosis not present

## 2019-08-14 LAB — SARS CORONAVIRUS 2 (TAT 6-24 HRS): SARS Coronavirus 2: NEGATIVE

## 2019-08-18 ENCOUNTER — Other Ambulatory Visit: Payer: Self-pay

## 2019-08-18 ENCOUNTER — Other Ambulatory Visit: Payer: Self-pay | Admitting: Family Medicine

## 2019-08-18 ENCOUNTER — Ambulatory Visit (INDEPENDENT_AMBULATORY_CARE_PROVIDER_SITE_OTHER): Payer: 59 | Admitting: Internal Medicine

## 2019-08-18 DIAGNOSIS — R0609 Other forms of dyspnea: Secondary | ICD-10-CM

## 2019-08-18 DIAGNOSIS — R0600 Dyspnea, unspecified: Secondary | ICD-10-CM

## 2019-08-18 LAB — PULMONARY FUNCTION TEST
DL/VA % pred: 130 %
DL/VA: 5.76 ml/min/mmHg/L
DLCO cor % pred: 121 %
DLCO cor: 26.49 ml/min/mmHg
DLCO unc % pred: 121 %
DLCO unc: 26.49 ml/min/mmHg
FEF 25-75 Post: 4.25 L/sec
FEF 25-75 Pre: 3.96 L/sec
FEF2575-%Change-Post: 7 %
FEF2575-%Pred-Post: 152 %
FEF2575-%Pred-Pre: 141 %
FEV1-%Change-Post: 1 %
FEV1-%Pred-Post: 119 %
FEV1-%Pred-Pre: 117 %
FEV1-Post: 3.01 L
FEV1-Pre: 2.96 L
FEV1FVC-%Change-Post: 2 %
FEV1FVC-%Pred-Pre: 106 %
FEV6-%Change-Post: -1 %
FEV6-%Pred-Post: 109 %
FEV6-%Pred-Pre: 111 %
FEV6-Post: 3.3 L
FEV6-Pre: 3.33 L
FEV6FVC-%Pred-Post: 102 %
FEV6FVC-%Pred-Pre: 102 %
FVC-%Change-Post: -1 %
FVC-%Pred-Post: 107 %
FVC-%Pred-Pre: 108 %
FVC-Post: 3.3 L
FVC-Pre: 3.33 L
Post FEV1/FVC ratio: 91 %
Post FEV6/FVC ratio: 100 %
Pre FEV1/FVC ratio: 89 %
Pre FEV6/FVC Ratio: 100 %
RV % pred: 57 %
RV: 0.93 L
TLC % pred: 83 %
TLC: 4.23 L

## 2019-08-18 NOTE — Progress Notes (Signed)
PFT done today. 

## 2019-08-27 ENCOUNTER — Encounter: Payer: Self-pay | Admitting: Family Medicine

## 2019-09-06 ENCOUNTER — Other Ambulatory Visit: Payer: Self-pay | Admitting: Family Medicine

## 2019-09-06 DIAGNOSIS — G43809 Other migraine, not intractable, without status migrainosus: Secondary | ICD-10-CM

## 2019-09-07 NOTE — Telephone Encounter (Signed)
Is this okay to refill? 

## 2019-09-23 ENCOUNTER — Other Ambulatory Visit: Payer: Self-pay | Admitting: Medical

## 2019-09-23 DIAGNOSIS — G43809 Other migraine, not intractable, without status migrainosus: Secondary | ICD-10-CM

## 2019-09-29 ENCOUNTER — Other Ambulatory Visit: Payer: Self-pay | Admitting: Nurse Practitioner

## 2019-09-29 DIAGNOSIS — K8020 Calculus of gallbladder without cholecystitis without obstruction: Secondary | ICD-10-CM

## 2019-10-06 ENCOUNTER — Other Ambulatory Visit: Payer: Self-pay | Admitting: Nurse Practitioner

## 2019-10-06 ENCOUNTER — Encounter: Payer: Self-pay | Admitting: Family Medicine

## 2019-10-06 ENCOUNTER — Ambulatory Visit
Admission: RE | Admit: 2019-10-06 | Discharge: 2019-10-06 | Disposition: A | Discharge status: Home / Self Care | Payer: 59 | Source: Ambulatory Visit | Attending: Nurse Practitioner | Admitting: Nurse Practitioner

## 2019-10-06 DIAGNOSIS — K8020 Calculus of gallbladder without cholecystitis without obstruction: Secondary | ICD-10-CM

## 2019-10-06 HISTORY — PX: CHOLECYSTECTOMY: SHX55

## 2019-10-11 ENCOUNTER — Encounter: Payer: Self-pay | Admitting: Family Medicine

## 2019-10-11 ENCOUNTER — Telehealth: Payer: 59 | Admitting: Family Medicine

## 2019-10-11 ENCOUNTER — Other Ambulatory Visit: Payer: Self-pay

## 2019-10-11 ENCOUNTER — Other Ambulatory Visit: Payer: Self-pay | Admitting: Family Medicine

## 2019-10-11 DIAGNOSIS — G43809 Other migraine, not intractable, without status migrainosus: Secondary | ICD-10-CM

## 2019-10-11 MED ORDER — UBRELVY 50 MG PO TABS: 1.0000 | ORAL_TABLET | ORAL | 2 refills | Status: DC

## 2019-10-11 NOTE — Progress Notes (Signed)
   Subjective:    Patient ID: Tamara Mckenzie, female    DOB: 03-11-1975, 44 y.o.   MRN: [id number]  HPI I connected with  Tamara Mckenzie on 10/11/19 by a video enabled telemedicine application and verified that I am speaking with the correct person using two identifiers. I discussed the limitations of evaluation and management by telemedicine. The patient expressed understanding and agreed to proceed. I am in my office she was at home I am in my office and she is at her house. 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  She is now having some gallbladder issues and does have an ultrasound ordered.  This is being taken care of at [facility]  Since her weight loss surgery she has noted that her migraines have increased to 2 to 4/week.  She used to have an aura but does not have an aura anymore and she cannot take NSAIDs because of the surgery.  She is taking Tylenol alone, amitriptyline and Topamax.    Review of Systems     Objective:   Physical Exam Alert and in no distress otherwise not examined       Assessment & Plan:  Migraine variant with headache - Plan: Ubrogepant (UBRELVY) 50 MG TABS I will continue her on you Bilbrey, amitriptyline and Tenormin.  She is to increase her Topamax to 100 mg daily.  She is to call me in 2 weeks and if still having difficulty, I will then have her try Aimovig.  She was comfortable with that.

## 2019-10-11 NOTE — Telephone Encounter (Signed)
[facility] requesting to fill pt topamax. Please advise [facility]

## 2019-11-03 ENCOUNTER — Encounter: Payer: Self-pay | Admitting: Family Medicine

## 2019-11-13 ENCOUNTER — Other Ambulatory Visit: Payer: Self-pay | Admitting: Family Medicine

## 2019-11-13 DIAGNOSIS — I10 Essential (primary) hypertension: Secondary | ICD-10-CM

## 2019-12-02 ENCOUNTER — Encounter: Payer: Self-pay | Admitting: Family Medicine

## 2019-12-04 ENCOUNTER — Other Ambulatory Visit: Payer: Self-pay | Admitting: Family Medicine

## 2019-12-04 DIAGNOSIS — G43809 Other migraine, not intractable, without status migrainosus: Secondary | ICD-10-CM

## 2019-12-06 NOTE — Telephone Encounter (Signed)
Is her dose was increased to 100mg  then we can send in 100mg  and not 50mg  correct

## 2019-12-07 MED ORDER — TOPIRAMATE 100 MG PO TABS: 100.0000 mg | ORAL_TABLET | Freq: Every day | ORAL | 1 refills | Status: DC

## 2019-12-11 ENCOUNTER — Other Ambulatory Visit: Payer: Self-pay | Admitting: Family Medicine

## 2019-12-11 DIAGNOSIS — I10 Essential (primary) hypertension: Secondary | ICD-10-CM

## 2019-12-20 ENCOUNTER — Encounter: Payer: Self-pay | Admitting: Family Medicine

## 2019-12-20 ENCOUNTER — Ambulatory Visit: Payer: 59 | Admitting: Family Medicine

## 2019-12-20 ENCOUNTER — Other Ambulatory Visit: Payer: Self-pay

## 2019-12-20 VITALS — BP 128/84 | HR 87 | Temp 97.9°F | Ht 64.5 in | Wt 199.0 lb

## 2019-12-20 DIAGNOSIS — Z23 Encounter for immunization: Secondary | ICD-10-CM

## 2019-12-20 DIAGNOSIS — Z9049 Acquired absence of other specified parts of digestive tract: Secondary | ICD-10-CM

## 2019-12-20 DIAGNOSIS — K909 Intestinal malabsorption, unspecified: Secondary | ICD-10-CM

## 2019-12-20 DIAGNOSIS — I10 Essential (primary) hypertension: Secondary | ICD-10-CM

## 2019-12-20 DIAGNOSIS — Z1159 Encounter for screening for other viral diseases: Secondary | ICD-10-CM | POA: Diagnosis not present

## 2019-12-20 DIAGNOSIS — G43809 Other migraine, not intractable, without status migrainosus: Secondary | ICD-10-CM | POA: Diagnosis not present

## 2019-12-20 DIAGNOSIS — R197 Diarrhea, unspecified: Secondary | ICD-10-CM

## 2019-12-20 DIAGNOSIS — Z9884 Bariatric surgery status: Secondary | ICD-10-CM

## 2019-12-20 LAB — LIPID PANEL

## 2019-12-20 MED ORDER — CHOLESTYRAMINE 4 G PO PACK: 4.0000 g | PACK | Freq: Two times a day (BID) | ORAL | 12 refills | Status: DC

## 2019-12-20 NOTE — Patient Instructions (Signed)
Start with Questran once per day to see if it was slowing down then go to 2 and possibly even 3 times per day and keep track of what foods make it worse

## 2019-12-20 NOTE — Progress Notes (Signed)
   Subjective:    Patient ID: Tamara Mckenzie, female    DOB: [birth year], 44 y.o.   MRN: [id number]  HPI She is here for a med check appointment.  She does have a history of migraine headaches and presently is taking Topamax 100 mg/day, atenolol 25 mg that she is reduced herself down to that level and does use Ubrelvy when she gets a headache.  She is now usually having 1/week down from 2 to 3/week and is very happy with this.  She had a laparoscopic cholecystectomy in September and back in March had a biliopancreatic diversion.  She is still having difficulty with diarrhea from that.  She has lost a significant amount of weight.  Presently she is dealing with symptoms of depression with crying and irritability.  She will plan to discuss this further with psychologist associated with the weight loss program.   Review of Systems     Objective:   Physical Exam Alert and in no distress. Tympanic membranes and canals are normal. Pharyngeal area is normal. Neck is supple without adenopathy or thyromegaly. Cardiac exam shows a regular sinus rhythm without murmurs or gallops. Lungs are clear to auscultation.        Assessment & Plan:  S/P biliopancreatic diversion with duodenal switch - Plan: CBC with Differential/Platelet, Comprehensive metabolic panel, Lipid panel, VITAMIN D 25 Hydroxy (Vit-D Deficiency, Fractures), Iron, Ferritin  S/P laparoscopic cholecystectomy  Essential hypertension, benign - Plan: CBC with Differential/Platelet, Comprehensive metabolic panel  Migraine variant with headache  Morbid obesity (HCC) - Plan: CBC with Differential/Platelet, Comprehensive metabolic panel, Lipid panel  Need for hepatitis C screening test - Plan: Hepatitis C antibody  Need for influenza vaccination - Plan: Flu Vaccine QUAD 36+ mos IM  Diarrhea due to malabsorption - Plan: cholestyramine (QUESTRAN) 4 g packet  She is to follow-up with counseling through the weight loss program and may possibly  referred for further evaluation.  I will work with her concerning this.  She will continue on her present migraine medication as well as blood pressure medicine.  I will give her Questran to help with her diarrhea.  We will start with a low dose and increase as needed.  She will also monitor her food intake to see if there are any particular things to cause trouble.  She was comfortable with that.  Discussed her weight loss with her in terms of setting goals of pants or dress sizes as opposed to truly a weight loss.  Greater than 30 minutes spent reviewing her record exam and consultation.

## 2019-12-21 ENCOUNTER — Other Ambulatory Visit: Payer: Self-pay | Admitting: Family Medicine

## 2019-12-21 DIAGNOSIS — G43809 Other migraine, not intractable, without status migrainosus: Secondary | ICD-10-CM

## 2019-12-21 LAB — CBC WITH DIFFERENTIAL/PLATELET
Basophils Absolute: 0 10*3/uL (ref 0.0–0.2)
Basos: 0 %
EOS (ABSOLUTE): 0.1 10*3/uL (ref 0.0–0.4)
Eos: 3 %
Hematocrit: 35.2 % (ref 34.0–46.6)
Hemoglobin: 11.6 g/dL (ref 11.1–15.9)
Immature Grans (Abs): 0 10*3/uL (ref 0.0–0.1)
Immature Granulocytes: 0 %
Lymphocytes Absolute: 1.8 10*3/uL (ref 0.7–3.1)
Lymphs: 38 %
MCH: 29.4 pg (ref 26.6–33.0)
MCHC: 33 g/dL (ref 31.5–35.7)
MCV: 89 fL (ref 79–97)
Monocytes Absolute: 0.3 10*3/uL (ref 0.1–0.9)
Monocytes: 6 %
Neutrophils Absolute: 2.5 10*3/uL (ref 1.4–7.0)
Neutrophils: 53 %
Platelets: 337 10*3/uL (ref 150–450)
RBC: 3.95 x10E6/uL (ref 3.77–5.28)
RDW: 13.8 % (ref 11.7–15.4)
WBC: 4.7 10*3/uL (ref 3.4–10.8)

## 2019-12-21 LAB — COMPREHENSIVE METABOLIC PANEL
ALT: 23 IU/L (ref 0–32)
AST: 20 IU/L (ref 0–40)
Albumin/Globulin Ratio: 1.2 (ref 1.2–2.2)
Albumin: 3.6 g/dL — ABNORMAL LOW (ref 3.8–4.8)
Alkaline Phosphatase: 134 IU/L — ABNORMAL HIGH (ref 44–121)
BUN/Creatinine Ratio: 21 (ref 9–23)
BUN: 14 mg/dL (ref 6–24)
Bilirubin Total: 0.4 mg/dL (ref 0.0–1.2)
CO2: 18 mmol/L — ABNORMAL LOW (ref 20–29)
Calcium: 8.7 mg/dL (ref 8.7–10.2)
Chloride: 109 mmol/L — ABNORMAL HIGH (ref 96–106)
Creatinine, Ser: 0.67 mg/dL (ref 0.57–1.00)
GFR calc Af Amer: 125 mL/min/{1.73_m2} (ref >59)
GFR calc non Af Amer: 109 mL/min/{1.73_m2} (ref >59)
Globulin, Total: 2.9 g/dL (ref 1.5–4.5)
Glucose: 85 mg/dL (ref 65–99)
Potassium: 3.6 mmol/L (ref 3.5–5.2)
Sodium: 140 mmol/L (ref 134–144)
Total Protein: 6.5 g/dL (ref 6.0–8.5)

## 2019-12-21 LAB — VITAMIN D 25 HYDROXY (VIT D DEFICIENCY, FRACTURES): Vit D, 25-Hydroxy: 42.2 ng/mL (ref 30.0–100.0)

## 2019-12-21 LAB — LIPID PANEL
Chol/HDL Ratio: 2.3 ratio (ref 0.0–4.4)
Cholesterol, Total: 110 mg/dL (ref 100–199)
HDL: 48 mg/dL (ref >39)
LDL Chol Calc (NIH): 49 mg/dL (ref 0–99)
Triglycerides: 56 mg/dL (ref 0–149)
VLDL Cholesterol Cal: 13 mg/dL (ref 5–40)

## 2019-12-21 LAB — HEPATITIS C ANTIBODY: Hep C Virus Ab: 0.2 s/co ratio (ref 0.0–0.9)

## 2019-12-21 LAB — IRON: Iron: 43 ug/dL (ref 27–159)

## 2019-12-21 LAB — FERRITIN: Ferritin: 68 ng/mL (ref 15–150)

## 2019-12-21 NOTE — Telephone Encounter (Signed)
[facility]. Please advise [facility]

## 2019-12-29 ENCOUNTER — Encounter: Payer: Self-pay | Admitting: Family Medicine

## 2020-01-07 ENCOUNTER — Other Ambulatory Visit: Payer: Self-pay | Admitting: Family Medicine

## 2020-01-07 DIAGNOSIS — I10 Essential (primary) hypertension: Secondary | ICD-10-CM

## 2020-01-12 ENCOUNTER — Other Ambulatory Visit: Payer: Self-pay | Admitting: Family Medicine

## 2020-01-12 DIAGNOSIS — I10 Essential (primary) hypertension: Secondary | ICD-10-CM

## 2020-01-16 ENCOUNTER — Other Ambulatory Visit: Payer: Self-pay | Admitting: Family Medicine

## 2020-01-16 DIAGNOSIS — G43809 Other migraine, not intractable, without status migrainosus: Secondary | ICD-10-CM

## 2020-01-17 NOTE — Telephone Encounter (Signed)
[facility]. Please advise. KH

## 2020-02-18 ENCOUNTER — Other Ambulatory Visit: Payer: Self-pay | Admitting: Family Medicine

## 2020-02-18 DIAGNOSIS — G43809 Other migraine, not intractable, without status migrainosus: Secondary | ICD-10-CM

## 2020-02-21 NOTE — Telephone Encounter (Signed)
Is this okay to refill? 

## 2020-02-22 ENCOUNTER — Encounter: Payer: Self-pay | Admitting: Family Medicine

## 2020-02-22 ENCOUNTER — Telehealth (INDEPENDENT_AMBULATORY_CARE_PROVIDER_SITE_OTHER): Payer: BC Managed Care – PPO | Admitting: Family Medicine

## 2020-02-22 VITALS — Temp 97.9°F | Wt 183.0 lb

## 2020-02-22 DIAGNOSIS — N3000 Acute cystitis without hematuria: Secondary | ICD-10-CM | POA: Diagnosis not present

## 2020-02-22 MED ORDER — SULFAMETHOXAZOLE-TRIMETHOPRIM 800-160 MG PO TABS: 1.0000 | ORAL_TABLET | Freq: Two times a day (BID) | ORAL | 0 refills | Status: DC

## 2020-02-22 MED ORDER — FLUCONAZOLE 150 MG PO TABS
150.0000 mg | ORAL_TABLET | Freq: Once | ORAL | 0 refills | Status: AC
Start: 2020-02-22 — End: 2020-02-22

## 2020-02-22 NOTE — Progress Notes (Signed)
   Subjective:    Patient ID: [name], female    DOB: 1975-09-22, 44 y.o.   MRN: [id number]  HPI I connected with  JERZI TIGERT on 02/22/20 by a video enabled telemedicine application and verified that I am speaking with the correct person using two identifiers.  Caregility used.Me;office.patient:hime I discussed the limitations of evaluation and management by telemedicine. The patient expressed understanding and agreed to proceed. She complains of a 2-week history of dysuria but no fever, chills.  She is having some back pains at the end of urination.  She has seen no blood in her urine.  Review of Systems     Objective:   Physical Exam Alert and in no distress otherwise not examined       Assessment & Plan:  Acute cystitis without hematuria - Plan: sulfamethoxazole-trimethoprim (BACTRIM DS) 800-160 MG tablet I will call in Septra as this does sound like a UTI.  Also Diflucan was called in as she does sometimes have difficulty with antibiotic associated yeast vaginitis.  She will take the Diflucan if she starts noticing symptoms. 21 minutes spent today in reviewing her medical record, history evaluation consultation and documentation.

## 2020-02-25 ENCOUNTER — Telehealth: Payer: Self-pay

## 2020-02-25 NOTE — Telephone Encounter (Signed)
Recv'd fax for P.A. Bernita Raisin but pt doesn't have active insurance

## 2020-03-04 NOTE — Telephone Encounter (Signed)
Called pt and left message, need new insurance info for P.A.

## 2020-03-10 DIAGNOSIS — E569 Vitamin deficiency, unspecified: Secondary | ICD-10-CM | POA: Diagnosis not present

## 2020-03-10 DIAGNOSIS — Z713 Dietary counseling and surveillance: Secondary | ICD-10-CM | POA: Diagnosis not present

## 2020-03-10 DIAGNOSIS — Z9884 Bariatric surgery status: Secondary | ICD-10-CM | POA: Diagnosis not present

## 2020-03-16 DIAGNOSIS — E569 Vitamin deficiency, unspecified: Secondary | ICD-10-CM | POA: Diagnosis not present

## 2020-03-16 DIAGNOSIS — Z113 Encounter for screening for infections with a predominantly sexual mode of transmission: Secondary | ICD-10-CM | POA: Diagnosis not present

## 2020-03-16 DIAGNOSIS — Z6831 Body mass index (BMI) 31.0-31.9, adult: Secondary | ICD-10-CM | POA: Diagnosis not present

## 2020-03-16 DIAGNOSIS — Z1231 Encounter for screening mammogram for malignant neoplasm of breast: Secondary | ICD-10-CM | POA: Diagnosis not present

## 2020-03-16 DIAGNOSIS — Z01419 Encounter for gynecological examination (general) (routine) without abnormal findings: Secondary | ICD-10-CM | POA: Diagnosis not present

## 2020-03-16 LAB — HM MAMMOGRAPHY

## 2020-03-23 DIAGNOSIS — E569 Vitamin deficiency, unspecified: Secondary | ICD-10-CM | POA: Diagnosis not present

## 2020-03-23 DIAGNOSIS — E43 Unspecified severe protein-calorie malnutrition: Secondary | ICD-10-CM | POA: Diagnosis not present

## 2020-03-28 ENCOUNTER — Telehealth: Payer: Self-pay

## 2020-03-28 NOTE — Telephone Encounter (Signed)
Left another message for pt, closing for now

## 2020-03-29 ENCOUNTER — Telehealth: Payer: Self-pay

## 2020-03-29 NOTE — Telephone Encounter (Signed)
Pt called with new ins info.  Her BC[employer] anthem in system is correct but pharmacy info is [facility] [id number] PCN [id number] ID# [id number]  P.A. UBRELVY completed.  She stated she does have co pay discount card already

## 2020-03-30 NOTE — Telephone Encounter (Signed)
P.A.was denied states pt has to have trial of 2 generic triptans.  Pt states that she picked up discount card already and has been getting it for $0 co pay

## 2020-04-08 ENCOUNTER — Other Ambulatory Visit: Payer: Self-pay | Admitting: Family Medicine

## 2020-04-08 DIAGNOSIS — I10 Essential (primary) hypertension: Secondary | ICD-10-CM

## 2020-04-10 ENCOUNTER — Encounter: Payer: 59 | Admitting: Family Medicine

## 2020-05-26 ENCOUNTER — Other Ambulatory Visit: Payer: Self-pay | Admitting: Family Medicine

## 2020-05-26 DIAGNOSIS — G43809 Other migraine, not intractable, without status migrainosus: Secondary | ICD-10-CM

## 2020-05-29 NOTE — Telephone Encounter (Signed)
[facility]rel[facility] . Please advise [facility]

## 2020-06-21 ENCOUNTER — Ambulatory Visit: Payer: BC Managed Care – PPO | Admitting: Family Medicine

## 2020-06-21 ENCOUNTER — Encounter: Payer: Self-pay | Admitting: Family Medicine

## 2020-06-21 ENCOUNTER — Other Ambulatory Visit: Payer: Self-pay

## 2020-06-21 VITALS — BP 110/78 | HR 79 | Temp 96.0°F | Ht 64.0 in | Wt 162.4 lb

## 2020-06-21 DIAGNOSIS — Z0000 Encounter for general adult medical examination without abnormal findings: Secondary | ICD-10-CM | POA: Diagnosis not present

## 2020-06-21 DIAGNOSIS — J301 Allergic rhinitis due to pollen: Secondary | ICD-10-CM

## 2020-06-21 DIAGNOSIS — Z9049 Acquired absence of other specified parts of digestive tract: Secondary | ICD-10-CM

## 2020-06-21 DIAGNOSIS — E282 Polycystic ovarian syndrome: Secondary | ICD-10-CM

## 2020-06-21 DIAGNOSIS — K219 Gastro-esophageal reflux disease without esophagitis: Secondary | ICD-10-CM

## 2020-06-21 DIAGNOSIS — Z9884 Bariatric surgery status: Secondary | ICD-10-CM | POA: Diagnosis not present

## 2020-06-21 DIAGNOSIS — K117 Disturbances of salivary secretion: Secondary | ICD-10-CM

## 2020-06-21 DIAGNOSIS — I10 Essential (primary) hypertension: Secondary | ICD-10-CM

## 2020-06-21 DIAGNOSIS — G43809 Other migraine, not intractable, without status migrainosus: Secondary | ICD-10-CM

## 2020-06-21 DIAGNOSIS — Z1321 Encounter for screening for nutritional disorder: Secondary | ICD-10-CM | POA: Diagnosis not present

## 2020-06-21 DIAGNOSIS — Z803 Family history of malignant neoplasm of breast: Secondary | ICD-10-CM | POA: Insufficient documentation

## 2020-06-21 MED ORDER — TOPIRAMATE 100 MG PO TABS: 100.0000 mg | ORAL_TABLET | Freq: Every day | ORAL | 3 refills | Status: DC

## 2020-06-21 MED ORDER — UBRELVY 50 MG PO TABS: 1.0000 | ORAL_TABLET | ORAL | 2 refills | Status: DC

## 2020-06-21 NOTE — Progress Notes (Signed)
   Subjective:    Patient ID: Tamara Mckenzie, female    DOB: 02/16/1976, 45 y.o.   MRN: [id number]  HPI She is here for complete examination.  She has had bariatric surgery and has lost over 120 pounds because of this.  She is on multivitamins and has made major dietary changes to help with that.  She does have reflux disease but is having no trouble.  She is very happy with this.  Also there is a strong history of breast cancer in her family.  Her mother does not have about all of her sisters apparently have.  She is getting regular mammograms because of this.  She also follows up regularly with gynecology.  She does have a history of PCOS but presently is on no medications for that.  She continues on her blood pressure medication of a atenolol and is having no difficulty with that.  She has her migraines are under good control with the use of Topamax and Ubrelvy.  She is engaged to be married.  Recent history shows she had a cholecystectomy in August 2021.  She states that she still does wake up with a dry mouth but blames this mainly on sleeping with her mouth open all night.  Usually this is remedied with water.  Her allergies seem to be under good control.   Review of Systems  All other systems reviewed and are negative.      Objective:   Physical Exam Alert and in no distress. Tympanic membranes and canals are normal. Pharyngeal area is normal. Neck is supple without adenopathy or thyromegaly. Cardiac exam shows a regular sinus rhythm without murmurs or gallops. Lungs are clear to auscultation.       Assessment & Plan:  Routine general medical examination at a health care facility - Plan: CBC with Differential/Platelet, Comprehensive metabolic panel, Lipid panel  S/P laparoscopic cholecystectomy - Plan: CBC with Differential/Platelet, Comprehensive metabolic panel, Lipid panel  Migraine variant with headache  S/P biliopancreatic diversion with duodenal switch  Family history of breast  cancer  Essential hypertension, benign  Seasonal allergic rhinitis due to pollen  PCOS (polycystic ovarian syndrome)  Gastroesophageal reflux disease without esophagitis  Xerostomia  Encounter for vitamin deficiency screening - Plan: VITAMIN D 25 Hydroxy (Vit-D Deficiency, Fractures)  Did recommend she stop the Tenormin and keep track of her blood pressure to see if she indeed does need blood pressure medication at the present time.  She was very comfortable with that.  She will keep me informed.  Otherwise continue on her migraine medications.

## 2020-06-21 NOTE — Patient Instructions (Signed)
Go ahead and stop the Tenormin and check your blood pressure weekly.  As long as it stays below 130/80.

## 2020-06-22 DIAGNOSIS — E43 Unspecified severe protein-calorie malnutrition: Secondary | ICD-10-CM | POA: Diagnosis not present

## 2020-06-22 DIAGNOSIS — Z9884 Bariatric surgery status: Secondary | ICD-10-CM | POA: Diagnosis not present

## 2020-06-22 DIAGNOSIS — E569 Vitamin deficiency, unspecified: Secondary | ICD-10-CM | POA: Diagnosis not present

## 2020-06-22 LAB — LIPID PANEL
Chol/HDL Ratio: 2 ratio (ref 0.0–4.4)
Cholesterol, Total: 121 mg/dL (ref 100–199)
HDL: 61 mg/dL (ref >39)
LDL Chol Calc (NIH): 50 mg/dL (ref 0–99)
Triglycerides: 41 mg/dL (ref 0–149)
VLDL Cholesterol Cal: 10 mg/dL (ref 5–40)

## 2020-06-22 LAB — COMPREHENSIVE METABOLIC PANEL
ALT: 22 IU/L (ref 0–32)
AST: 17 IU/L (ref 0–40)
Albumin/Globulin Ratio: 1.4 (ref 1.2–2.2)
Albumin: 4 g/dL (ref 3.8–4.8)
Alkaline Phosphatase: 108 IU/L (ref 44–121)
BUN/Creatinine Ratio: 17 (ref 9–23)
BUN: 12 mg/dL (ref 6–24)
Bilirubin Total: 0.5 mg/dL (ref 0.0–1.2)
CO2: 21 mmol/L (ref 20–29)
Calcium: 8.8 mg/dL (ref 8.7–10.2)
Chloride: 108 mmol/L — ABNORMAL HIGH (ref 96–106)
Creatinine, Ser: 0.72 mg/dL (ref 0.57–1.00)
Globulin, Total: 2.9 g/dL (ref 1.5–4.5)
Glucose: 83 mg/dL (ref 65–99)
Potassium: 3.3 mmol/L — ABNORMAL LOW (ref 3.5–5.2)
Sodium: 142 mmol/L (ref 134–144)
Total Protein: 6.9 g/dL (ref 6.0–8.5)
eGFR: 107 mL/min/{1.73_m2} (ref >59)

## 2020-06-22 LAB — CBC WITH DIFFERENTIAL/PLATELET
Basophils Absolute: 0 10*3/uL (ref 0.0–0.2)
Basos: 1 %
EOS (ABSOLUTE): 0.1 10*3/uL (ref 0.0–0.4)
Eos: 3 %
Hematocrit: 35.7 % (ref 34.0–46.6)
Hemoglobin: 11.9 g/dL (ref 11.1–15.9)
Immature Grans (Abs): 0 10*3/uL (ref 0.0–0.1)
Immature Granulocytes: 0 %
Lymphocytes Absolute: 1.5 10*3/uL (ref 0.7–3.1)
Lymphs: 46 %
MCH: 30 pg (ref 26.6–33.0)
MCHC: 33.3 g/dL (ref 31.5–35.7)
MCV: 90 fL (ref 79–97)
Monocytes Absolute: 0.2 10*3/uL (ref 0.1–0.9)
Monocytes: 7 %
Neutrophils Absolute: 1.4 10*3/uL (ref 1.4–7.0)
Neutrophils: 43 %
Platelets: 270 10*3/uL (ref 150–450)
RBC: 3.97 x10E6/uL (ref 3.77–5.28)
RDW: 12.3 % (ref 11.7–15.4)
WBC: 3.3 10*3/uL — ABNORMAL LOW (ref 3.4–10.8)

## 2020-06-22 LAB — VITAMIN D 25 HYDROXY (VIT D DEFICIENCY, FRACTURES): Vit D, 25-Hydroxy: 37.3 ng/mL (ref 30.0–100.0)

## 2020-07-05 ENCOUNTER — Other Ambulatory Visit: Payer: Self-pay | Admitting: Family Medicine

## 2020-07-05 NOTE — Telephone Encounter (Signed)
Is this okay to refill? 

## 2020-07-17 DIAGNOSIS — Z9884 Bariatric surgery status: Secondary | ICD-10-CM | POA: Diagnosis not present

## 2020-07-17 DIAGNOSIS — Z713 Dietary counseling and surveillance: Secondary | ICD-10-CM | POA: Diagnosis not present

## 2020-07-19 ENCOUNTER — Other Ambulatory Visit: Payer: Self-pay | Admitting: Family Medicine

## 2020-07-19 DIAGNOSIS — I10 Essential (primary) hypertension: Secondary | ICD-10-CM

## 2020-08-07 ENCOUNTER — Encounter: Payer: Self-pay | Admitting: Family Medicine

## 2020-08-07 ENCOUNTER — Other Ambulatory Visit: Payer: Self-pay

## 2020-08-07 ENCOUNTER — Telehealth: Payer: BC Managed Care – PPO | Admitting: Family Medicine

## 2020-08-07 VITALS — Temp 97.0°F | Wt 151.6 lb

## 2020-08-07 DIAGNOSIS — U071 COVID-19: Secondary | ICD-10-CM | POA: Diagnosis not present

## 2020-08-07 NOTE — Progress Notes (Signed)
   Subjective:    Patient ID: Tamara Mckenzie, female    DOB: 1975-11-26, 45 y.o.   MRN: [id number]  HPI Documentation for virtual audio and video telecommunications through Caregility encounter: The patient was located at home. 2 patient identifiers used.  The provider was located in the office. The patient did consent to this visit and is aware of possible charges through their insurance for this visit. The other persons participating in this telemedicine service were none. Time spent on call was 5 minutes and in review of previous records >20 minutes total for counseling and coordination of care. This virtual service is not related to other E/M service within previous 7 days.  She states that yesterday she developed a slight sore throat and headache that continued till today.  She been tested himself and was positive for COVID.  She is now having some difficulty with fatigue and chills.  She otherwise has no other symptoms.  She has had 3 of the vaccines.  Review of Systems     Objective:   Physical Exam Alert and in no distress with a slightly gravelly voice.       Assessment & Plan:  COVID-19 Discussed proper care including Tylenol for fever aches and pains, symptomatic care for cough and congestion.  Discussed that if she has difficulty with increasing fever, coughing or shortness of breath to call.  Discussed dental viral medication and possibility of using at a later date.  Discussed the fact that after 5 days if she is asymptomatic or having symptoms resolved, she can resume normal activities outside the house with a mask.  She will keep in touch concerning worse when she does have a sore throat.  She was comfortable with that.

## 2020-08-14 ENCOUNTER — Encounter: Payer: Self-pay | Admitting: Family Medicine

## 2020-08-30 NOTE — Progress Notes (Signed)
Chief Complaint  Patient presents with   other    Lingering symptoms from covid, still has cough from covid talks on the phone at her job which makes her start coughing, rash on back of both hands itchy and discolored came back again this week, see's derm in Sept. Rash since May.    Had video visit with Dr. Redmond School on 6/13, [facility] She has had a lingering cough which catches her off-guard.  She works from home, talking on the phone a lot.  If she exhales hard, she can hear a rattle and trigger a dry cough. Cough is never productive.  Denies any nasal drainage, sinus pain, ear pain, sore throat. No shortness of breath, chest tightness, wheezing. She used Mucinex DM during COVID illness.  Never produced phlegm. She also tried Delsym, didn't help.  She has had a rash on the back of her hands since May. It is very itchy, and hasn't responded to many various treatment she has tried. She treated herself for ringworm, poison ivy/oak, eczema, dry skin. Treatments tried include Clotrimazole, 1% hydrocortisone, Aveeno eczema therapy (which caused a rash which required her to use benadryl), topical oreganol --caused a breakout requiring benadryl.  She is currently taking oreganol drops (oral, "healthy immune support"), which helps some.  She reports that the rash started out gray and scaly. Currently it is hypopigmented. Using Aquaphor helps with the itching.  Also has rash on her eyelids and the corners of her eyes. She had weight loss surgery 1.5 years ago Had labs done 05/2020, and was told that her vitamin levels were okay. Review of results: Normal TSH, B12 377, WBC 3.2, otherwise normal CBC, c-met Vitamin D-OH 29  Hasn't changed her D supplement since those labs--she reports taking an OTC supplement of 50,000 IU (denies that it is 5000, reports is 50,000) and taking it MWF. States she was on same regimen prior to last labs  She had called to get a dermatology visit  to eval her rash, but isn't scheduled until September.  PMH, PSH, SH reviewed H/o bariatric surgery (not noted in her chart)  Outpatient Encounter Medications as of 08/31/2020  Medication Sig   amitriptyline (ELAVIL) 25 MG tablet TAKE 1 TABLET BY MOUTH EVERYDAY AT BEDTIME   Ergocalciferol (VITAMIN D2 PO) Take by mouth.   ferrous sulfate 325 (65 FE) MG tablet Take by mouth.   Menaquinone-7 (VITAMIN K2 PO) Take by mouth.   Multiple Vitamins-Minerals (MULTIVITAMIN WITH MINERALS) tablet Take 1 tablet by mouth daily.   topiramate (TOPAMAX) 100 MG tablet Take 1 tablet (100 mg total) by mouth daily.   Ubrogepant (UBRELVY) 50 MG TABS Take 1 tablet by mouth as directed. With headache and may repeat in 2 hours.   [DISCONTINUED] atenolol (TENORMIN) 50 MG tablet TAKE 1 TABLET BY MOUTH EVERY DAY   Eflornithine HCl (VANIQA) 13.9 % cream  (Patient not taking: No sig reported)   [DISCONTINUED] cholestyramine (QUESTRAN) 4 g packet Take 1 packet (4 g total) by mouth 2 (two) times daily.   No facility-administered encounter medications on file as of 08/31/2020.   Allergies  Allergen Reactions   Other Anaphylaxis, Itching and Rash   Lactose Nausea And Vomiting    Confirmed by outpatient RD- is vegan   ROS: No known f/c/n/v/d, chest pain, shortness of breath. Cough when talking a lot on the phone, per HPI. No urinary complaints. Has vaginal yeast infection, using Monistat currently. Rash per HPI.  PHYSICAL EXAM:  BP 130/82   Pulse 87   Temp 99.1 F (37.3 C)   Wt 152 lb 12.8 oz (69.3 kg)   BMI 26.23 kg/m   Pleasant, well-appearing female, in no distress HEENT: conjunctiva and sclera are clear, EOMI. OP is clear, nasal exam is normal. No sinus tenderness Neck: No lymphadenopathy or mass Heart: regular rate and rhythm Lungs: clear bilaterally, no wheezes, rales, ronchi, normal air movement.  Did forced expiration multiple times--no wheezing.  Only once had a small cough, resolved after 1-2 coughs  (didn't trigger any spasm of coughing). Skin: Hypopigmented, scaly/dry patches on backs of hands.  No central clearing, no other skin lesions noted. No visible rash on face. Psych: normal mood, affect, hygiene and grooming Neuro: alert and oriented, cranial nerves grossly normal, normal gait.   ASSESSMENT/PLAN:  Cough - lingering after COVID. Normal exam, no e/o RAD. Trial tessalon prn when working - Plan: benzonatate (TESSALON) 200 MG capsule  Chronic dermatitis of hands - suspect eczema vs contact derm. TAC BID for up to 2 weeks; cont Aquaphor frequently. f/u with derm as planned if not better - Plan: triamcinolone cream (KENALOG) 0.1 %  Vitamin D deficiency - dose doesn't sound right, but OTC (so may be different/less than rx).  Concern for low levels contributing to dermatitis, recheck today - Plan: VITAMIN D 25 Hydroxy (Vit-D Deficiency, Fractures)  I spent 33 minutes dedicated to the care of this patient, including pre-visit review of records, face to face time, post-visit ordering of testing and documentation.

## 2020-08-31 ENCOUNTER — Encounter: Payer: Self-pay | Admitting: Family Medicine

## 2020-08-31 ENCOUNTER — Other Ambulatory Visit: Payer: Self-pay

## 2020-08-31 ENCOUNTER — Other Ambulatory Visit: Payer: Self-pay | Admitting: Family Medicine

## 2020-08-31 ENCOUNTER — Ambulatory Visit (INDEPENDENT_AMBULATORY_CARE_PROVIDER_SITE_OTHER): Payer: BC Managed Care – PPO | Admitting: Family Medicine

## 2020-08-31 VITALS — BP 130/82 | HR 87 | Temp 99.1°F | Wt 152.8 lb

## 2020-08-31 DIAGNOSIS — R059 Cough, unspecified: Secondary | ICD-10-CM | POA: Diagnosis not present

## 2020-08-31 DIAGNOSIS — G43809 Other migraine, not intractable, without status migrainosus: Secondary | ICD-10-CM

## 2020-08-31 DIAGNOSIS — L309 Dermatitis, unspecified: Secondary | ICD-10-CM | POA: Diagnosis not present

## 2020-08-31 DIAGNOSIS — E559 Vitamin D deficiency, unspecified: Secondary | ICD-10-CM

## 2020-08-31 MED ORDER — BENZONATATE 200 MG PO CAPS
200.0000 mg | ORAL_CAPSULE | Freq: Three times a day (TID) | ORAL | 0 refills | Status: DC | PRN
Start: 2020-08-31 — End: 2021-04-05

## 2020-08-31 MED ORDER — TRIAMCINOLONE ACETONIDE 0.1 % EX CREA: 1.0000 "application " | TOPICAL_CREAM | Freq: Two times a day (BID) | CUTANEOUS | 0 refills | Status: DC

## 2020-08-31 NOTE — Patient Instructions (Signed)
Use the prescription steroid cream twice daily (no longer than 2 weeks, use only until symptoms resolve) If itching recurs but minimal rash, use the hydrocortisone and not the prescription cream. Drink plenty of water to stay well hydrated  Use the benzonatate on days that you work to help prevent the cough while talking a lot. If this doesn't help at all, we may need to consider a short steroid course.  We are checking another vitamin D level (these types of skin rashes tend to occur if levels are low.  You are on a very high dose, though over-the-counter, so I want to verify your level before making any recommendations.

## 2020-09-01 ENCOUNTER — Encounter: Payer: Self-pay | Admitting: Family Medicine

## 2020-09-01 LAB — VITAMIN D 25 HYDROXY (VIT D DEFICIENCY, FRACTURES): Vit D, 25-Hydroxy: 33.8 ng/mL (ref 30.0–100.0)

## 2020-09-07 NOTE — Telephone Encounter (Addendum)
P.A done on Ubrelvy, this was denied again for the second time this year. Patient has to try 2 generic migraine medications from at least two different categories with a consiting of a 3- month trail. Nurtec ODT will not be covered as well. Please advise. If patient needs to be on this medication, we can try to appeal it from when it P.A was done back earlier this year

## 2020-12-02 ENCOUNTER — Telehealth: Payer: Self-pay

## 2020-12-02 NOTE — Telephone Encounter (Signed)
Received another Tamara Mckenzie, this has already been denied this year, pt has been using discount card and able to get this medication.  Will sent mychart message

## 2020-12-21 ENCOUNTER — Telehealth: Payer: Self-pay | Admitting: Family Medicine

## 2020-12-21 DIAGNOSIS — Z9884 Bariatric surgery status: Secondary | ICD-10-CM | POA: Diagnosis not present

## 2020-12-21 NOTE — Telephone Encounter (Signed)
Pt called and stated that she is out of her Tamara Mckenzie for the month and wanted to see if she could get some samples? Please advise if this is ok?

## 2020-12-21 NOTE — Telephone Encounter (Signed)
Pt was called and advised to come by and pick up samples of ubrelvy 50 mg and appointment was made for her 12/29/20

## 2020-12-27 ENCOUNTER — Encounter: Payer: Self-pay | Admitting: Family Medicine

## 2020-12-27 ENCOUNTER — Other Ambulatory Visit: Payer: Self-pay

## 2020-12-27 ENCOUNTER — Ambulatory Visit: Payer: BC Managed Care – PPO | Admitting: Family Medicine

## 2020-12-27 ENCOUNTER — Other Ambulatory Visit: Payer: Self-pay | Admitting: Family Medicine

## 2020-12-27 VITALS — BP 110/80 | HR 72

## 2020-12-27 DIAGNOSIS — L709 Acne, unspecified: Secondary | ICD-10-CM

## 2020-12-27 DIAGNOSIS — G43809 Other migraine, not intractable, without status migrainosus: Secondary | ICD-10-CM

## 2020-12-27 DIAGNOSIS — E282 Polycystic ovarian syndrome: Secondary | ICD-10-CM | POA: Diagnosis not present

## 2020-12-27 DIAGNOSIS — Z9884 Bariatric surgery status: Secondary | ICD-10-CM | POA: Diagnosis not present

## 2020-12-27 MED ORDER — MINOCYCLINE HCL 100 MG PO CAPS: 100.0000 mg | ORAL_CAPSULE | Freq: Every day | ORAL | 1 refills | Status: DC

## 2020-12-27 MED ORDER — TOPIRAMATE 100 MG PO TABS
100.0000 mg | ORAL_TABLET | Freq: Every day | ORAL | 3 refills | Status: DC
Start: 2020-12-27 — End: 2022-02-08

## 2020-12-27 NOTE — Progress Notes (Signed)
   Subjective:    Patient ID: Tamara Mckenzie, female    DOB: 10/09/1975, 45 y.o.   MRN: [id number]  HPI She is here for consult concerning 2 issues.  Her migraines are starting to cause her more difficulty.  Normally with her present regimen she will have 1 or 2 a week that does respond to real [place].  Presently she is taking Topamax as well as amitriptyline.  She thinks a precipitating event could be the fact that she got married 5 weeks ago. Also she has noted over the last several months reoccurrence of acne.  She had as a teenager and was placed on an antibiotic to help with that.  She states that some are cystic in nature.  Other contributory factors are the fact that she did have weight loss surgery a year and a half ago and has lost significant amount of weight.  She also has underlying PCOS and her menses are irregular.   Review of Systems     Objective:   Physical Exam Alert and in no distress.  Inflammatory acne lesions are noted on the face.  She states that she also has them on her chest and the back area.       Assessment & Plan:  Migraine variant with headache - Plan: topiramate (TOPAMAX) 100 MG tablet  Acne, unspecified acne type - Plan: minocycline (MINOCIN) 100 MG capsule  S/P biliopancreatic diversion with duodenal switch  PCOS (polycystic ovarian syndrome) ITake 1-1/2 of the Topamax daily and send me a message in about 2 weeks on MyChart In terms of the acne it will take probably 6 weeks to see the full benefit

## 2020-12-27 NOTE — Patient Instructions (Addendum)
Take 1-1/2 of the Topamax daily and send me a message in about 2 weeks on MyChart In terms of the acne it will take probably 6 weeks to see the full benefit

## 2021-01-02 ENCOUNTER — Telehealth: Payer: Self-pay

## 2021-01-02 NOTE — Telephone Encounter (Signed)
Pt called & states co pay card no longer working at pharmacy.  P.A. was done months ago and denied.  I tried to renew P.A. with additional info but states unable to renew already denied.  Called plan [employer] t# [phone] and must file appeal, they are faxing form.  Form received and completed, but need pt's signature.  Left her a message and appeal letter typed.   Per old notes in chart Pt did try Sumatriptan and was unable to tolerate due to dizziness and nausea.  Also pt states she has taken Atenolol & Prozac and currently on Amitriptyline and Topamax.

## 2021-01-03 NOTE — Telephone Encounter (Signed)
Appeal letter faxed.

## 2021-01-07 NOTE — Telephone Encounter (Signed)
Appeal approved til 01/04/22, went thru for $0 co pay at pharmacy, left message for pt

## 2021-02-02 ENCOUNTER — Other Ambulatory Visit: Payer: Self-pay | Admitting: Family Medicine

## 2021-02-02 DIAGNOSIS — L309 Dermatitis, unspecified: Secondary | ICD-10-CM

## 2021-02-13 ENCOUNTER — Encounter: Payer: Self-pay | Admitting: Family Medicine

## 2021-02-13 ENCOUNTER — Other Ambulatory Visit: Payer: Self-pay | Admitting: Family Medicine

## 2021-02-13 DIAGNOSIS — G43809 Other migraine, not intractable, without status migrainosus: Secondary | ICD-10-CM

## 2021-02-15 ENCOUNTER — Other Ambulatory Visit: Payer: Self-pay | Admitting: Obstetrics and Gynecology

## 2021-02-15 DIAGNOSIS — Z1231 Encounter for screening mammogram for malignant neoplasm of breast: Secondary | ICD-10-CM

## 2021-02-28 DIAGNOSIS — L700 Acne vulgaris: Secondary | ICD-10-CM | POA: Diagnosis not present

## 2021-02-28 DIAGNOSIS — L308 Other specified dermatitis: Secondary | ICD-10-CM | POA: Diagnosis not present

## 2021-03-22 ENCOUNTER — Ambulatory Visit: Payer: 59

## 2021-03-22 ENCOUNTER — Ambulatory Visit: Payer: BC Managed Care – PPO | Admitting: Psychiatry

## 2021-03-25 ENCOUNTER — Other Ambulatory Visit: Payer: Self-pay | Admitting: Family Medicine

## 2021-03-25 DIAGNOSIS — G43809 Other migraine, not intractable, without status migrainosus: Secondary | ICD-10-CM

## 2021-03-26 NOTE — Telephone Encounter (Signed)
[facility]. Please advise. KH

## 2021-04-05 ENCOUNTER — Telehealth: Payer: Self-pay | Admitting: *Deleted

## 2021-04-05 ENCOUNTER — Other Ambulatory Visit: Payer: Self-pay | Admitting: Psychiatry

## 2021-04-05 ENCOUNTER — Encounter: Payer: Self-pay | Admitting: *Deleted

## 2021-04-05 ENCOUNTER — Ambulatory Visit: Payer: BC Managed Care – PPO | Admitting: Psychiatry

## 2021-04-05 ENCOUNTER — Encounter: Payer: Self-pay | Admitting: Psychiatry

## 2021-04-05 VITALS — BP 130/80 | HR 87 | Ht 64.0 in | Wt 148.8 lb

## 2021-04-05 DIAGNOSIS — G43119 Migraine with aura, intractable, without status migrainosus: Secondary | ICD-10-CM

## 2021-04-05 DIAGNOSIS — R519 Headache, unspecified: Secondary | ICD-10-CM | POA: Diagnosis not present

## 2021-04-05 MED ORDER — UBRELVY 100 MG PO TABS: 100.0000 mg | ORAL_TABLET | ORAL | 3 refills | Status: DC | PRN

## 2021-04-05 NOTE — Telephone Encounter (Signed)
Bernita Raisin PA, key [id number], [phone]. Your information has been sent to [facility]

## 2021-04-05 NOTE — Patient Instructions (Signed)
-  Decrease Topamax to 100 mg daily -Increase Ubrelvy to 100 mg as needed -MRI brain  Medication options: -Propranolol (daily pill) -Aimovig/Ajovy/Emgality (monthly injection) -Botox

## 2021-04-05 NOTE — Telephone Encounter (Signed)
Bernita Raisin PA: Bernita Raisin 10 tablets per fill is approved. This request does not meet criteria for the quantity requested. Your request for 16 tablets per fill exceeds the plan quantity limit of 10 tablets per fill. Will advise MD.

## 2021-04-05 NOTE — Progress Notes (Signed)
Referring:  Denita Lung, MD [address] [place],  [place] [address]  PCP: Denita Lung, MD  Neurology was asked to evaluate Merryl Hacker, a 46 year old female for a chief complaint of headaches.  Our recommendations of care will be communicated by shared medical record.    CC:  headaches  HPI:  Medical co-morbidities: PCOS, eczema, s/p gastric bypass  The patient presents for evaluation of headaches that began in elementary school. They have progressively worsened since June of last year. She is currently having migraines 3-4 times per week. They are described as a "numbing", pounding pain with associated photophobia, phonophobia, and nausea. They can last up to 3-5 hours at a time. She previously had a visual aura with her migraines but has not had one recently.  She is currently taking 150 mg of Topamax daily and amitriptyline 25 mg QHS. Increased Topamax from 100 mg 3 months ago and has not noticed any improvement but has had worsening of paresthesias. Takes Ubrelvy 50 mg as needed, which helps sometimes.  Headache History: Onset: elementary school Triggers: stress, lack of sleep, screens Aura: previously had halos, none recently Location: occipital, neck radiating forward Quality/Description: numbing, pounding Associated Symptoms:  Photophobia: yes  Phonophobia: yes  Nausea: yes Worse with activity?: yes Duration of headaches: 3-5 hours  Headache days per month: 16 Headache free days per month: 14  Current Treatment: Abortive Ubrelvy 50 mg PRN  Preventative Topamax 150 mg QHS Elavil 25 mg QHS  Prior Therapies                                 Ubrelvy 50 mg PRN Topamax 150 mg QHS - causing tingling of the fingertips Elavil 25 mg QHS Excedrin Goody's powder  Headache Risk Factors: Headache risk factors and/or co-morbidities (+) Neck Pain (+) TMJ Dysfunction/Bruxism  LABS: CBC    Component Value Date/Time   WBC 3.3 (L) 06/21/2020 1113   WBC  5.6 08/02/2016 1550   RBC 3.97 06/21/2020 1113   RBC 4.33 08/02/2016 1550   HGB 11.9 06/21/2020 1113   HCT 35.7 06/21/2020 1113   PLT 270 06/21/2020 1113   MCV 90 06/21/2020 1113   MCH 30.0 06/21/2020 1113   MCH 29.1 08/02/2016 1550   MCHC 33.3 06/21/2020 1113   MCHC 33.0 08/02/2016 1550   RDW 12.3 06/21/2020 1113   LYMPHSABS 1.5 06/21/2020 1113   MONOABS 336 08/02/2016 1550   EOSABS 0.1 06/21/2020 1113   BASOSABS 0.0 06/21/2020 1113   CMP Latest Ref Rng & Units 06/21/2020 12/20/2019 08/02/2016  Glucose 65 - 99 mg/dL 83 85 80  BUN 6 - 24 mg/dL 12 14 3(L)  Creatinine 0.57 - 1.00 mg/dL 0.72 0.67 0.70  Sodium 134 - 144 mmol/L 142 140 138  Potassium 3.5 - 5.2 mmol/L 3.3(L) 3.6 3.8  Chloride 96 - 106 mmol/L 108(H) 109(H) 106  CO2 20 - 29 mmol/L 21 18(L) 23  Calcium 8.7 - 10.2 mg/dL 8.8 8.7 8.9  Total Protein 6.0 - 8.5 g/dL 6.9 6.5 7.0  Total Bilirubin 0.0 - 1.2 mg/dL 0.5 0.4 0.5  Alkaline Phos 44 - 121 IU/L 108 134(H) 78  AST 0 - 40 IU/L 17 20 19   ALT 0 - 32 IU/L 22 23 21      IMAGING:  none   Current Outpatient Medications on File Prior to Visit  Medication Sig Dispense Refill   amitriptyline (ELAVIL) 25 MG  tablet TAKE 1 TABLET BY MOUTH EVERYDAY AT BEDTIME 90 tablet 3   Ergocalciferol (VITAMIN D2 PO) Take by mouth.     ferrous sulfate 325 (65 FE) MG tablet Take by mouth.     Lactobacillus (PROBIOTIC ACIDOPHILUS PO) Take by mouth daily. 50 billion     Menaquinone-7 (VITAMIN K2 PO) Take by mouth.     minocycline (MINOCIN) 100 MG capsule Take 1 capsule (100 mg total) by mouth daily. 90 capsule 1   Multiple Vitamins-Minerals (MULTIVITAMIN WITH MINERALS) tablet Take 1 tablet by mouth daily.     topiramate (TOPAMAX) 100 MG tablet Take 1 tablet (100 mg total) by mouth daily. 90 tablet 3   triamcinolone cream (KENALOG) 0.1 % APPLY 1 APPLICATION TOPICALLY 2 (TWO) TIMES DAILY. APPLY SPARINGLY TO AFFECTED AREAS OF SKIN TWICE DAILY UNTIL RESOLVED. DO NOT USE LONGER THAN 2 WEEKS 30 g 0    UNABLE TO FIND daily. Med Name gall bladder enzyme     Eflornithine HCl (VANIQA) 13.9 % cream  (Patient not taking: No sig reported)     No current facility-administered medications on file prior to visit.     Allergies: Allergies  Allergen Reactions   Other Anaphylaxis, Itching and Rash    latex   Lactose Nausea And Vomiting    Confirmed by outpatient RD- is vegan    Family History: Migraine or other headaches in the family:  no Aneurysms in a first degree relative:  no Brain tumors in the family:  no Other neurological illness in the family:   no  Past Medical History: Past Medical History:  Diagnosis Date   Allergy    Chronic headache    Hypertension    IBS (irritable bowel syndrome)    Insomnia    Mood disorder [facility])     Past Surgical History Past Surgical History:  Procedure Laterality Date   CHOLECYSTECTOMY  10/06/2019   OTHER SURGICAL HISTORY  05/10/2019   duodenal switch for weight loss    Social History: Social History   Tobacco Use   Smoking status: Never   Smokeless tobacco: Never  Substance Use Topics   Alcohol use: No   Drug use: No    ROS: Negative for fevers, chills. Positive for headaches. All other systems reviewed and negative unless stated otherwise in HPI.   Physical Exam:   Vital Signs: BP 130/80    Pulse 87    Ht 5\' 4"  (1.626 m)    Wt 148 lb 12.8 oz (67.5 kg)    BMI 25.54 kg/m  GENERAL: well appearing,in no acute distress,alert SKIN:  Color, texture, turgor normal. No rashes or lesions HEAD:  Normocephalic/atraumatic. CV:  RRR RESP: Normal respiratory effort MSK: no tenderness to palpation over occiput, neck, or shoulders  NEUROLOGICAL: Mental Status: Alert, oriented to person, place and time,Follows commands Cranial Nerves: PERRL,visual fields intact to confrontation,extraocular movements intact,facial sensation intact,no facial droop or ptosis,hearing grossly intact,no dysarthria Motor: muscle strength 5/5 both upper and  lower extremities,no drift, normal tone Reflexes: 2+ throughout Sensation: intact to light touch all 4 extremities Coordination: Finger-to- nose-finger intact bilaterally Gait: normal-based   IMPRESSION: 46 year old female with a history of PCOS, eczema, s/p gastric bypass surgery who presents for evaluation of migraines. Will order MRI brain for worsening headaches over the past 8 months despite treatment. She is continuing to have several migraines per week with Topamax and amitriptyline. Increased dose of Topamax has not helped her headaches but has worsened paresthesias. Will reduce dose back  to 100 mg daily. Discussed headache prevention options including beta blocker, CGRP, and Botox. She will discuss options with her husband and let me know her preference once she decides. In the meantime will increase Ubrelvy to 100 mg PRN to see if this provides more consistent relief.  PLAN: -MRI brain -Prevention: Decrease Topamax to 100 mg daily. Continue amitriptyline 25 mg QHS -Rescue: Increase Ubrelvy to 100 mg PRN -next steps: consider propranolol, CGRP, Botox   I spent a total of 30 minutes chart reviewing and counseling the patient. Headache education was done. Discussed treatment options including preventive and acute medications. Discussed medication side effects, adverse reactions and drug interactions. Written educational materials and patient instructions outlining all of the above were given.  Follow-up: 3 months   Genia Harold, MD 04/05/2021   10:03 AM

## 2021-04-05 NOTE — Telephone Encounter (Signed)
Rx updated, thanks.

## 2021-04-05 NOTE — Telephone Encounter (Signed)
PA closed by plan. Sent my chart advising patient her plan allows Ubrelvy 10 tabs for 30 days. Pharmacy can fill Rx for her.

## 2021-04-05 NOTE — Telephone Encounter (Signed)
Bernita Raisin PA with new quantity dispense Rx, key [id number]. Your information has been sent to [facility].

## 2021-04-09 ENCOUNTER — Telehealth: Payer: Self-pay | Admitting: Psychiatry

## 2021-04-09 NOTE — Telephone Encounter (Signed)
[employer] auth: NPR via [employer] portal order faxed to [facility] they will reach out to the patient to schedule.

## 2021-04-10 NOTE — Telephone Encounter (Signed)
schedule for 2/22 245PM

## 2021-04-17 ENCOUNTER — Telehealth: Payer: Self-pay

## 2021-04-17 NOTE — Telephone Encounter (Signed)
I submitted a PA request for Ubrelvy 100mg  qty #10 on CMM, [id number] - PA Case ID: .   Received this message: "Final Outcome has been reached on this event. Previous decision cannot be edited" I see now, there is a previous approval.

## 2021-04-19 DIAGNOSIS — R519 Headache, unspecified: Secondary | ICD-10-CM | POA: Diagnosis not present

## 2021-04-23 ENCOUNTER — Encounter: Payer: Self-pay | Admitting: Psychiatry

## 2021-04-23 ENCOUNTER — Telehealth: Payer: Self-pay | Admitting: *Deleted

## 2021-04-23 NOTE — Telephone Encounter (Signed)
Received MRI brain report for [facility]. Placed on Dr Quentin Mulling desk for review.

## 2021-04-26 ENCOUNTER — Telehealth: Payer: Self-pay | Admitting: Psychiatry

## 2021-04-26 NOTE — Telephone Encounter (Signed)
Completed [employer] New start Botox form. Placed in Nurse Pod for MD signature. ?

## 2021-04-30 NOTE — Telephone Encounter (Signed)
Faxed signed PA form with OV notes to [employer]. ?

## 2021-05-02 NOTE — Telephone Encounter (Signed)
Called [place] department [phone]) spoke with Lesly Rubenstein. She initiated PA for Botox over the phone, PA is going under further review will take about 5 calender days. Ref # [id number]. ?

## 2021-06-19 ENCOUNTER — Other Ambulatory Visit: Payer: Self-pay | Admitting: Family Medicine

## 2021-06-19 DIAGNOSIS — L709 Acne, unspecified: Secondary | ICD-10-CM

## 2021-06-25 ENCOUNTER — Encounter: Payer: BC Managed Care – PPO | Admitting: Family Medicine

## 2021-09-22 ENCOUNTER — Other Ambulatory Visit: Payer: Self-pay | Admitting: Family Medicine

## 2021-09-24 NOTE — Telephone Encounter (Signed)
Send in for 90-day supply without any refills.  Removed the refills that are listed.  Please get them in for physical fasting.  I do not see any recent physical in the past year

## 2021-10-31 ENCOUNTER — Encounter: Payer: Self-pay | Admitting: Internal Medicine

## 2021-11-29 ENCOUNTER — Ambulatory Visit: Payer: Self-pay | Admitting: Medical

## 2021-11-29 VITALS — BP 110/70 | HR 96 | Temp 98.0°F | Wt 144.4 lb

## 2021-11-29 DIAGNOSIS — R5383 Other fatigue: Secondary | ICD-10-CM

## 2021-11-29 DIAGNOSIS — R059 Cough, unspecified: Secondary | ICD-10-CM

## 2021-11-29 LAB — CBC WITH DIFFERENTIAL/PLATELET
Basophils Absolute: 0 10*3/uL (ref 0.0–0.2)
Basos: 0 %
EOS (ABSOLUTE): 0.2 10*3/uL (ref 0.0–0.4)
Eos: 3 %
Hematocrit: 32.1 % — ABNORMAL LOW (ref 34.0–46.6)
Hemoglobin: 10.7 g/dL — ABNORMAL LOW (ref 11.1–15.9)
Lymphocytes Absolute: 1.8 10*3/uL (ref 0.7–3.1)
Lymphs: 36 %
MCH: 28.9 pg (ref 26.6–33.0)
MCHC: 33.3 g/dL (ref 31.5–35.7)
MCV: 87 fL (ref 79–97)
Monocytes Absolute: 0.4 10*3/uL (ref 0.1–0.9)
Monocytes: 7 %
Neutrophils Absolute: 2.6 10*3/uL (ref 1.4–7.0)
Neutrophils: 54 %
Platelets: 329 10*3/uL (ref 150–450)
RBC: 3.7 x10E6/uL — ABNORMAL LOW (ref 3.77–5.28)
RDW: 15.6 % — ABNORMAL HIGH (ref 11.7–15.4)
WBC: 4.9 10*3/uL (ref 3.4–10.8)

## 2021-11-29 LAB — POCT INFLUENZA A/B
Influenza A, POC: NEGATIVE
Influenza B, POC: NEGATIVE

## 2021-11-29 LAB — POC COVID19 BINAXNOW: SARS Coronavirus 2 Ag: NEGATIVE

## 2021-11-29 MED ORDER — AZITHROMYCIN 250 MG PO TABS: ORAL_TABLET | ORAL | 0 refills | Status: DC

## 2021-11-29 MED ORDER — PREDNISONE 10 MG PO TABS: 20.0000 mg | ORAL_TABLET | Freq: Every day | ORAL | 0 refills | Status: DC

## 2021-11-29 NOTE — Patient Instructions (Signed)
Recommendations We will call the CBC blood count results Begin Z-Pak antibiotic for 5 days Finish out your Cipro regarding your urinary tract infection I sent a medicine called prednisone steroid to take as well but do not start this until we call you back about the blood counts Rest Hydrate well throughout the day at least 80 ounces of water a day or Pedialyte or Gatorade If not much improved in the next 3 days call or recheck

## 2021-11-29 NOTE — Progress Notes (Signed)
Subjective:  Tamara Mckenzie is a 46 y.o. female who presents for Chief Complaint  Patient presents with   URI    Cold symptoms- sick since last Friday. Rattling sound, coughing, no sore throat, low grade fever, chills, sweating in sleep, fatigue, no smell and taste, diarrhea off and on. Covid test negative on Friday and Monday.      Here for illness.  Started 6 days ago with sore throat that was bad, worried about strep initially.  That got better, then had runny nose, congestion for a few days.  Did covid test that was negative.   Still has ongoing congestion, head congestion, sore throat, not feeling well.  Had some diarrhea.  Using OTC cold medication.   This past week lost smell.   Still was worried about covid.   A second covid test was negative.  Started getting some diarrhea.  Feels a lot f congestion in chest.   Chest is rattly.  Currently using mucinex DM and Theraflu.   Emergency is drained if active. Having some dizziness.   Husband had diarrhea 2 days ago but no other sick contacts.  Nonsmoker, no hx/o asthma.   No other aggravating or relieving factors.    No other c/o.  Past Medical History:  Diagnosis Date   Allergy    Chronic headache    Hypertension    IBS (irritable bowel syndrome)    Insomnia    Mood disorder (HCC)    Current Outpatient Medications on File Prior to Visit  Medication Sig Dispense Refill   amitriptyline (ELAVIL) 25 MG tablet TAKE 1 TABLET BY MOUTH EVERYDAY AT BEDTIME 90 tablet 0   Ergocalciferol (VITAMIN D2 PO) Take by mouth.     Lactobacillus (PROBIOTIC ACIDOPHILUS PO) Take by mouth daily. 50 billion     Menaquinone-7 (VITAMIN K2 PO) Take by mouth.     Multiple Vitamins-Minerals (MULTIVITAMIN WITH MINERALS) tablet Take 1 tablet by mouth daily.     topiramate (TOPAMAX) 100 MG tablet Take 1 tablet (100 mg total) by mouth daily. 90 tablet 3   triamcinolone cream (KENALOG) 0.1 % APPLY 1 APPLICATION TOPICALLY 2 (TWO) TIMES DAILY. APPLY SPARINGLY TO AFFECTED  AREAS OF SKIN TWICE DAILY UNTIL RESOLVED. DO NOT USE LONGER THAN 2 WEEKS 30 g 0   Ubrogepant (UBRELVY) 100 MG TABS Take 100 mg by mouth as needed. May repeat a dose in 2 hours if headache persists. Max dose 2 pills in 24 hours. 10 tablet 3   ciprofloxacin (CIPRO) 250 MG tablet Take 1 tablet by mouth in the morning and at bedtime.     No current facility-administered medications on file prior to visit.   The following portions of the patient's history were reviewed and updated as appropriate: allergies, current medications, past family history, past medical history, past social history, past surgical history and problem list.  ROS Otherwise as in subjective above    Objective: BP 110/70   Pulse 96   Temp 98 F (36.7 C)   Wt 144 lb 6.4 oz (65.5 kg)   SpO2 98%   BMI 24.79 kg/m   General appearance: alert, no distress, well developed, well nourished, somewhat ill appearing  Nares with clear discharge, +erythema, pharynx normal Oral cavity: MMM, no lesions Neck: supple, no lymphadenopathy, no thyromegaly, no masses Heart: RRR, normal S1, S2, no murmurs Lungs: +rhonchi, bronchial sounds, no rales Pulses: 2+ radial pulses, 2+ pedal pulses, normal cap refill Ext: no edema   Assessment: Encounter Diagnoses  Name  Primary?   Cough, unspecified type Yes   Other fatigue      Plan: Discussed symptoms, exam findings .Marland Kitchen Offered CXR but she declines for now  Patient Instructions  Recommendations We will call the CBC blood count results Begin Z-Pak antibiotic for 5 days Finish out your Cipro regarding your urinary tract infection I sent a medicine called prednisone steroid to take as well but do not start this until we call you back about the blood counts Rest Hydrate well throughout the day at least 80 ounces of water a day or Pedialyte or Gatorade If not much improved in the next 3 days call or recheck   Tamara Mckenzie was seen today for uri.  Diagnoses and all orders for this  visit:  Cough, unspecified type -     POC COVID-19 -     Influenza A/B -     CBC with Differential/Platelet  Other fatigue -     POC COVID-19 -     Influenza A/B -     CBC with Differential/Platelet  Other orders -     azithromycin (ZITHROMAX) 250 MG tablet; 2 tablets day 1, then 1 tablet days 2-4 -     predniSONE (DELTASONE) 10 MG tablet; Take 2 tablets (20 mg total) by mouth daily with breakfast.    Follow up: pending labs

## 2021-12-17 ENCOUNTER — Encounter: Payer: Self-pay | Admitting: Internal Medicine

## 2022-01-02 ENCOUNTER — Other Ambulatory Visit: Payer: Self-pay | Admitting: Medical

## 2022-02-06 ENCOUNTER — Other Ambulatory Visit: Payer: Self-pay | Admitting: Family Medicine

## 2022-02-06 NOTE — Telephone Encounter (Signed)
Is this okay to refill? 

## 2022-02-08 ENCOUNTER — Other Ambulatory Visit: Payer: Self-pay | Admitting: Family Medicine

## 2022-02-08 DIAGNOSIS — G43809 Other migraine, not intractable, without status migrainosus: Secondary | ICD-10-CM

## 2022-02-08 MED ORDER — TOPIRAMATE 100 MG PO TABS: 100.0000 mg | ORAL_TABLET | Freq: Every day | ORAL | 3 refills | Status: DC

## 2022-02-08 NOTE — Telephone Encounter (Signed)
Pt requesting refill on topiramate to [id number] - [place], [place] - [address] . She has scheduled a med check 02/26/22 and her physical next year in March.

## 2022-02-26 ENCOUNTER — Encounter: Payer: Self-pay | Admitting: Family Medicine

## 2022-03-13 ENCOUNTER — Encounter: Payer: Self-pay | Admitting: Family Medicine

## 2022-05-01 LAB — HM MAMMOGRAPHY

## 2022-05-09 ENCOUNTER — Encounter: Payer: Self-pay | Admitting: Family Medicine

## 2022-05-16 ENCOUNTER — Other Ambulatory Visit: Payer: Self-pay | Admitting: Family Medicine

## 2022-06-11 ENCOUNTER — Other Ambulatory Visit: Payer: Self-pay

## 2022-06-11 ENCOUNTER — Encounter (HOSPITAL_COMMUNITY): Payer: Self-pay

## 2022-06-11 ENCOUNTER — Emergency Department (HOSPITAL_COMMUNITY)
Admission: EM | Admit: 2022-06-11 | Discharge: 2022-06-12 | Disposition: A | Discharge status: Home / Self Care | Payer: PRIVATE HEALTH INSURANCE | Attending: Emergency Medicine | Admitting: Emergency Medicine

## 2022-06-11 ENCOUNTER — Emergency Department (HOSPITAL_COMMUNITY): Payer: PRIVATE HEALTH INSURANCE

## 2022-06-11 DIAGNOSIS — R5383 Other fatigue: Secondary | ICD-10-CM | POA: Insufficient documentation

## 2022-06-11 DIAGNOSIS — R079 Chest pain, unspecified: Secondary | ICD-10-CM | POA: Insufficient documentation

## 2022-06-11 DIAGNOSIS — R0602 Shortness of breath: Secondary | ICD-10-CM | POA: Diagnosis not present

## 2022-06-11 DIAGNOSIS — I10 Essential (primary) hypertension: Secondary | ICD-10-CM | POA: Insufficient documentation

## 2022-06-11 DIAGNOSIS — R42 Dizziness and giddiness: Secondary | ICD-10-CM | POA: Insufficient documentation

## 2022-06-11 LAB — BASIC METABOLIC PANEL
Anion gap: 6 (ref 5–15)
BUN: 19 mg/dL (ref 6–20)
CO2: 22 mmol/L (ref 22–32)
Calcium: 8.6 mg/dL — ABNORMAL LOW (ref 8.9–10.3)
Chloride: 107 mmol/L (ref 98–111)
Creatinine, Ser: 0.77 mg/dL (ref 0.44–1.00)
GFR, Estimated: >60 mL/min (ref >60)
Glucose, Bld: 98 mg/dL (ref 70–99)
Potassium: 4.1 mmol/L (ref 3.5–5.1)
Sodium: 135 mmol/L (ref 135–145)

## 2022-06-11 LAB — CBC WITH DIFFERENTIAL/PLATELET
Abs Immature Granulocytes: 0.01 10*3/uL (ref 0.00–0.07)
Basophils Absolute: 0 10*3/uL (ref 0.0–0.1)
Basophils Relative: 1 %
Eosinophils Absolute: 0.2 10*3/uL (ref 0.0–0.5)
Eosinophils Relative: 4 %
HCT: 35.6 % — ABNORMAL LOW (ref 36.0–46.0)
Hemoglobin: 11 g/dL — ABNORMAL LOW (ref 12.0–15.0)
Immature Granulocytes: 0 %
Lymphocytes Relative: 47 %
Lymphs Abs: 2.2 10*3/uL (ref 0.7–4.0)
MCH: 26.1 pg (ref 26.0–34.0)
MCHC: 30.9 g/dL (ref 30.0–36.0)
MCV: 84.6 fL (ref 80.0–100.0)
Monocytes Absolute: 0.3 10*3/uL (ref 0.1–1.0)
Monocytes Relative: 7 %
Neutro Abs: 1.9 10*3/uL (ref 1.7–7.7)
Neutrophils Relative %: 41 %
Platelets: 340 10*3/uL (ref 150–400)
RBC: 4.21 MIL/uL (ref 3.87–5.11)
RDW: 17.7 % — ABNORMAL HIGH (ref 11.5–15.5)
WBC: 4.7 10*3/uL (ref 4.0–10.5)
nRBC: 0 % (ref 0.0–0.2)

## 2022-06-11 LAB — TROPONIN I (HIGH SENSITIVITY)
Troponin I (High Sensitivity): <2 ng/L (ref <18)
Troponin I (High Sensitivity): <2 ng/L (ref <18)

## 2022-06-11 NOTE — ED Triage Notes (Signed)
Pt arrives with c/o fatigue and dizziness that started a few days. Pt endorses SOB and CP. Pt denies sick symptoms or n/v.

## 2022-06-11 NOTE — ED Provider Notes (Signed)
[place] [facility] AT [facility] Provider Note   CSN: [id number] Arrival date & time: 06/11/22  1949     History {Add pertinent medical, surgical, social history, OB history to HPI:1} Chief Complaint  Patient presents with   Fatigue    Tamara Mckenzie is a 47 y.o. female.  The history is provided by the patient and medical records.   47 y.o. F with hx of HTN, GERD, migraine headaches, PCOS, presenting to the ED for fatigue. States intermittently over the past few weeks she has been having episodes of lightheadedness, chest pain, SOB and some palpitations.  These episodes seem to come on randomly, brief but scare her when they occur.  She also reports she feels very fatigued and cold, almost feeling like no circulation in her fingers.  She did have duodenal switch for weight loss in 2021, concerned that some of her vitamins may be low.  She is still eating/drinking as normal since her surgery, no vomiting.  No fevers or infectious symptoms.  Home Medications Prior to Admission medications   Medication Sig Start Date End Date Taking? Authorizing Provider  amitriptyline (ELAVIL) 25 MG tablet TAKE 1 TABLET BY MOUTH EVERYDAY AT BEDTIME 02/07/22   Ronnald Nian, MD  azithromycin (ZITHROMAX) 250 MG tablet 2 tablets day 1, then 1 tablet days 2-4 11/29/21   Tysinger, Kermit Balo, PA-C  ciprofloxacin (CIPRO) 250 MG tablet Take 1 tablet by mouth in the morning and at bedtime. 11/26/21   [provider]  Ergocalciferol (VITAMIN D2 PO) Take by mouth.    [provider]  Lactobacillus (PROBIOTIC ACIDOPHILUS PO) Take by mouth daily. 50 billion    [provider]  Menaquinone-7 (VITAMIN K2 PO) Take by mouth.    [provider]  Multiple Vitamins-Minerals (MULTIVITAMIN WITH MINERALS) tablet Take 1 tablet by mouth daily.    [provider]  predniSONE (DELTASONE) 10 MG tablet Take 2 tablets (20 mg total) by mouth daily with breakfast.  11/29/21   Tysinger, Kermit Balo, PA-C  topiramate (TOPAMAX) 100 MG tablet Take 1 tablet (100 mg total) by mouth daily. 02/08/22   Ronnald Nian, MD  triamcinolone cream (KENALOG) 0.1 % APPLY 1 APPLICATION TOPICALLY 2 (TWO) TIMES DAILY. APPLY SPARINGLY TO AFFECTED AREAS OF SKIN TWICE DAILY UNTIL RESOLVED. DO NOT USE LONGER THAN 2 WEEKS 02/02/21   Ronnald Nian, MD  Ubrogepant (UBRELVY) 100 MG TABS Take 100 mg by mouth as needed. May repeat a dose in 2 hours if headache persists. Max dose 2 pills in 24 hours. 04/05/21   Ocie Doyne, MD      Allergies    Other and Lactose    Review of Systems   Review of Systems  Constitutional:  Positive for fatigue.  Respiratory:  Positive for shortness of breath.   Cardiovascular:  Positive for chest pain.  Neurological:  Positive for light-headedness.  All other systems reviewed and are negative.   Physical Exam Updated Vital Signs BP (!) 134/96 (BP Location: Right Arm)   Pulse 74   Temp 99 F (37.2 C) (Oral)   Resp 18   Wt 65.3 kg   LMP 06/07/2022   SpO2 100%   BMI 24.72 kg/m   Physical Exam Vitals and nursing note reviewed.  Constitutional:      Appearance: She is well-developed.  HENT:     Head: Normocephalic and atraumatic.  Eyes:     Conjunctiva/sclera: Conjunctivae normal.     Pupils: Pupils are  equal, round, and reactive to light.  Cardiovascular:     Rate and Rhythm: Normal rate and regular rhythm.     Heart sounds: Normal heart sounds.  Pulmonary:     Effort: Pulmonary effort is normal. No respiratory distress.     Breath sounds: Normal breath sounds. No rhonchi.  Abdominal:     General: Bowel sounds are normal.     Palpations: Abdomen is soft.  Musculoskeletal:        General: Normal range of motion.     Cervical back: Normal range of motion.  Skin:    General: Skin is warm and dry.  Neurological:     Mental Status: She is alert and oriented to person, place, and time.     Comments: AAOx3, answering questions and  following commands appropriately; equal strength UE and LE bilaterally; CN grossly intact; moves all extremities appropriately without ataxia; no focal neuro deficits or facial asymmetry appreciated     ED Results / Procedures / Treatments   Labs (all labs ordered are listed, but only abnormal results are displayed) Labs Reviewed  CBC WITH DIFFERENTIAL/PLATELET - Abnormal; Notable for the following components:      Result Value   Hemoglobin 11.0 (*)    HCT 35.6 (*)    RDW 17.7 (*)    All other components within normal limits  BASIC METABOLIC PANEL - Abnormal; Notable for the following components:   Calcium 8.6 (*)    All other components within normal limits  TSH  T4, FREE  TROPONIN I (HIGH SENSITIVITY)  TROPONIN I (HIGH SENSITIVITY)    EKG None  Radiology DG Chest 2 View  Result Date: 06/11/2022 CLINICAL DATA:  Shortness of breath for 3 days EXAM: CHEST - 2 VIEW COMPARISON:  None Available. FINDINGS: The heart size and mediastinal contours are within normal limits. Both lungs are clear. The visualized skeletal structures are unremarkable. IMPRESSION: No active cardiopulmonary disease. Electronically Signed   By: Tiburcio Pea M.D.   On: 06/11/2022 21:18    Procedures Procedures  {Document cardiac monitor, telemetry assessment procedure when appropriate:1}  Medications Ordered in ED Medications - No data to display  ED Course/ Medical Decision Making/ A&P   {   Click here for ABCD2, HEART and other calculatorsREFRESH Note before signing :1}                          Medical Decision Making Amount and/or Complexity of Data Reviewed Labs: ordered. Radiology: ordered.   ***  {Document critical care time when appropriate:1} {Document review of labs and clinical decision tools ie heart score, Chads2Vasc2 etc:1}  {Document your independent review of radiology images, and any outside records:1} {Document your discussion with family members, caretakers, and with  consultants:1} {Document social determinants of health affecting pt's care:1} {Document your decision making why or why not admission, treatments were needed:1} Final Clinical Impression(s) / ED Diagnoses Final diagnoses:  None    Rx / DC Orders ED Discharge Orders     None

## 2022-06-12 LAB — TSH: TSH: 1.577 u[IU]/mL (ref 0.350–4.500)

## 2022-06-12 LAB — T4, FREE: Free T4: 0.63 ng/dL (ref 0.61–1.12)

## 2022-06-12 NOTE — Discharge Instructions (Addendum)
Your cardiac work-up today was normal. Thyroid testing was also normal.  May need some further testing for nutritional deficiencies with your primary care doctor. Return here for any new/acute changes.

## 2022-06-20 ENCOUNTER — Inpatient Hospital Stay: Payer: Self-pay | Admitting: Family Medicine

## 2023-03-26 ENCOUNTER — Other Ambulatory Visit: Payer: Self-pay | Admitting: Family Medicine

## 2023-03-26 DIAGNOSIS — G43809 Other migraine, not intractable, without status migrainosus: Secondary | ICD-10-CM

## 2023-04-22 ENCOUNTER — Encounter: Payer: Self-pay | Admitting: Internal Medicine

## 2023-09-09 LAB — HM MAMMOGRAPHY

## 2023-09-15 LAB — HM PAP SMEAR: HPV, high-risk: NEGATIVE

## 2023-09-23 ENCOUNTER — Telehealth: Payer: Self-pay

## 2023-09-23 NOTE — Telephone Encounter (Signed)
 Copied from CRM [id number]. Topic: Clinical - Medical Advice >> Sep 23, 2023 10:53 AM Antwanette L wrote: Reason for CRM: The patient has a physical on 7/30 at 1:30pm with Dr. Reyne. The patient had bariatric surgery 2.5-3 years ago and she cannot fast 6 hours prior to the appt. Please contact the patient at [phone]   Can Pt come in early or a different day to have labs done?

## 2023-09-24 ENCOUNTER — Encounter: Payer: Self-pay | Admitting: Family Medicine

## 2023-09-24 ENCOUNTER — Ambulatory Visit: Payer: PRIVATE HEALTH INSURANCE | Admitting: Family Medicine

## 2023-09-24 VITALS — BP 114/70 | HR 115 | Ht 65.0 in | Wt 154.4 lb

## 2023-09-24 DIAGNOSIS — Z9884 Bariatric surgery status: Secondary | ICD-10-CM

## 2023-09-24 DIAGNOSIS — D509 Iron deficiency anemia, unspecified: Secondary | ICD-10-CM | POA: Insufficient documentation

## 2023-09-24 DIAGNOSIS — K9589 Other complications of other bariatric procedure: Secondary | ICD-10-CM

## 2023-09-24 DIAGNOSIS — R000 Tachycardia, unspecified: Secondary | ICD-10-CM

## 2023-09-24 DIAGNOSIS — D508 Other iron deficiency anemias: Secondary | ICD-10-CM

## 2023-09-24 DIAGNOSIS — L700 Acne vulgaris: Secondary | ICD-10-CM

## 2023-09-24 DIAGNOSIS — E559 Vitamin D deficiency, unspecified: Secondary | ICD-10-CM | POA: Diagnosis not present

## 2023-09-24 DIAGNOSIS — K909 Intestinal malabsorption, unspecified: Secondary | ICD-10-CM

## 2023-09-24 MED ORDER — ADAPALENE-BENZOYL PEROXIDE 0.1-2.5 % EX GEL
CUTANEOUS | 0 refills | Status: AC
Start: 2023-09-24 — End: ?

## 2023-09-24 MED ORDER — VITAMIN D (ERGOCALCIFEROL) 1.25 MG (50000 UNIT) PO CAPS
50000.0000 [IU] | ORAL_CAPSULE | ORAL | 0 refills | Status: AC
Start: 2023-09-24 — End: 2023-12-23

## 2023-09-24 MED ORDER — IRON (FERROUS SULFATE) 325 (65 FE) MG PO TABS: 325.0000 mg | ORAL_TABLET | Freq: Every day | ORAL | 0 refills | Status: DC

## 2023-09-24 NOTE — Progress Notes (Signed)
 Name: Tamara Mckenzie   Date of Visit: 09/24/23   Date of last visit with me: Visit date not found   CHIEF COMPLAINT:  Chief Complaint  Patient presents with   Annual Exam    Physical. No other concerns. Have been dealing with some heart palpations and fatigues. Has had blood work done today.        HPI:  Discussed the use of AI scribe software for clinical note transcription with the patient, who gave verbal consent to proceed.  History of Present Illness   Tamara Mckenzie is a 48 year old female with a history of bariatric surgery and polycystic ovary syndrome who presents with fatigue and iron  deficiency.  She has experienced significant fatigue since undergoing bariatric surgery in 2020. The fatigue is persistent, with periods of improvement and worsening. Various tests, including lung and heart evaluations, have been conducted in the past, and she reports that no cause was found at that time. Recent lab work showed significant iron  deficiency.  She has been taking a multivitamin containing iron , though she is unsure of the exact dosage. Previously, she used iron  drops but discontinued them due to teeth staining. She craves ice, which she associates with her iron  deficiency. She recalls a similar episode of fatigue and heart palpitations in May 2024, during which her labs were reportedly normal.  Her menstrual cycles have been irregular due to polycystic ovary syndrome (PCOS), and she suspects she is going through perimenopause. Currently, she experiences either spotting or no bleeding at all. No heavy menstrual bleeding.  She has lost approximately 160 pounds since her bariatric surgery. She does not consume beef or pork but eats fish and chicken. She has been taking vitamin D  supplements, initially at 50,000 IU daily, then reduced to twice a week.  She experiences tingling in her arms and restless leg syndrome. She is concerned about her insurance coverage for treatments and has  been managing her symptoms with over-the-counter medications.  She reports issues with acne, noting that her skin clears when she feels unwell and worsens when she feels better. 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 She is concerned about the internal causes of her acne.         OBJECTIVE:       09/24/2023    1:54 PM  Depression screen PHQ 2/9  Decreased Interest 0  Down, Depressed, Hopeless 0  PHQ - 2 Score 0     BP Readings from Last 3 Encounters:  09/24/23 114/70  06/12/22 110/72  11/29/21 110/70    BP 114/70   Pulse (!) 115   Ht 5' 5 (1.651 m)   Wt 154 lb 6.4 oz (70 kg)   BMI 25.69 kg/m    Physical Exam   MEASUREMENTS: Weight- 154.      Physical Exam Constitutional:      Appearance: Normal appearance.  Cardiovascular:     Rate and Rhythm: Normal rate and regular rhythm.  Neurological:     Mental Status: She is alert.     ASSESSMENT/PLAN:   Assessment & Plan Iron  deficiency anemia following bariatric surgery  Cystic acne  Vitamin D  deficiency  Status post bariatric surgery  Intestinal malabsorption, unspecified type  Tachycardia    Assessment and Plan    Iron  deficiency anemia post bariatric surgery Significant iron  deficiency anemia likely due to malabsorption post bariatric surgery. Symptoms include fatigue, ice cravings, and restless leg syndrome.  Differential diagnosis suggests absorption issues rather than dietary deficiency. - Coordinate with infusion center for iron  infusion, consider Feraheme or Venofer. - Prescribe oral iron  supplements, 65 mg elemental iron  daily. - Advise Metamucil for bowel regulation. - Monitor iron  levels every 3-6 months.  Vitamin D  deficiency due to malabsorption post bariatric surgery Vitamin D  level borderline low at 29.7 ng/mL despite previous high-dose supplementation, likely due to malabsorption post-bariatric surgery. -  Increase vitamin D  supplementation to 50,000 IU every other day. - Monitor vitamin D  levels regularly.  Acne Acne flares correlate with periods of feeling better, possibly due to inflammatory responses. Concerns about the impact of bariatric surgery on medication absorption. - Prescribe benzoyl peroxide  plus adapalene  topical treatment. - Advise starting application three times a week, increasing as tolerated.      Tachycardia - I suspect patient's tachycardia is secondary to iron  deficiency anemia.  Will address anemia concerns and have patient follow-up and reevaluate with EKG at that time.   Tinleigh Whitmire A. Vita MD [facility]

## 2023-09-24 NOTE — Addendum Note (Signed)
 Addended by: VICCI HUSBAND A on: 09/24/2023 04:38 PM   Modules accepted: Orders

## 2023-09-25 ENCOUNTER — Ambulatory Visit: Payer: Self-pay

## 2023-09-25 NOTE — Telephone Encounter (Signed)
  FYI Only or Action Required?: Action required by provider: would like something for nausea due to taking iron  pills.  Patient was last seen in primary care on 09/24/2023 by Vita Morrow, MD.  Called Nurse Triage reporting Nausea.  Symptoms began yesterday.  Interventions attempted: Dietary changes.  Symptoms are: unchanged.  Triage Disposition: Home Care  Patient/caregiver understands and will follow disposition?: No, wishes to speak with PCP    Copied from CRM #[id number]. Topic: Clinical - Medication Question >> Sep 25, 2023 11:53 AM Graeme ORN wrote: Reason for CRM: Patient called. Was seen yesterday prescribed iron . Bottle says to eat crackers or bread if stomach upset. Patient did that. Still having nausea after 1st dose. Wants to know if something can be prescribed/suggested for nausea. Thank You Reason for Disposition  Unexplained nausea  Answer Assessment - Initial Assessment Questions 1. NAUSEA SEVERITY: How bad is the nausea? (e.g., mild, moderate, severe; dehydration, weight loss)     moderate 2. ONSET: When did the nausea begin?     yesterday 3. VOMITING: Any vomiting? If Yes, ask: How many times today?     no 4. RECURRENT SYMPTOM: Have you had nausea before? If Yes, ask: When was the last time? What happened that time?     no 5. CAUSE: What do you think is causing the nausea?     Iron  pills 6. PREGNANCY: Is there any chance you are pregnant? (e.g., unprotected intercourse, missed birth control pill, broken condom)     na  Protocols used: Nausea-A-AH

## 2023-09-26 ENCOUNTER — Telehealth: Payer: Self-pay

## 2023-09-26 NOTE — Telephone Encounter (Signed)
 Dr. Joyce, patient will be scheduled as soon as possible.  Patient is receiving Advance Medication - Supplied Externally. Medication: Feraheme Manufacture: [employer] Approval Dates: Approved from 09/26/2023 until 10/26/2023. ID: [id number] Reason: Self Pay

## 2023-09-30 ENCOUNTER — Ambulatory Visit (INDEPENDENT_AMBULATORY_CARE_PROVIDER_SITE_OTHER): Payer: PRIVATE HEALTH INSURANCE

## 2023-09-30 ENCOUNTER — Encounter: Payer: Self-pay | Admitting: Family Medicine

## 2023-09-30 VITALS — BP 121/75 | HR 74 | Temp 98.0°F | Resp 18 | Ht 65.0 in | Wt 155.4 lb

## 2023-09-30 DIAGNOSIS — Z9884 Bariatric surgery status: Secondary | ICD-10-CM

## 2023-09-30 DIAGNOSIS — D508 Other iron deficiency anemias: Secondary | ICD-10-CM

## 2023-09-30 DIAGNOSIS — D509 Iron deficiency anemia, unspecified: Secondary | ICD-10-CM | POA: Diagnosis not present

## 2023-09-30 MED ORDER — SODIUM CHLORIDE 0.9 % IV SOLN
510.0000 mg | Freq: Once | INTRAVENOUS | Status: AC
  Administered 2023-09-30: 510 mg via INTRAVENOUS
  Filled 2023-09-30: qty 17

## 2023-09-30 NOTE — Patient Instructions (Signed)

## 2023-09-30 NOTE — Progress Notes (Signed)
 Diagnosis: Iron  Deficiency Anemia  Provider:  Praveen Mannam MD  Procedure: IV Infusion  IV Type: Peripheral, IV Location: L Antecubital  Feraheme (Ferumoxytol ), Dose: 510 mg  Infusion Start Time: 1547  Infusion Stop Time: 1606  Post Infusion IV Care: Observation period completed and Peripheral IV Discontinued  Discharge: Condition: Good, Destination: Home . AVS Provided  Performed by:  Dellie Piasecki, RN

## 2023-10-01 ENCOUNTER — Telehealth: Payer: Self-pay

## 2023-10-01 NOTE — Telephone Encounter (Signed)
 Copied from CRM #[id number]. Topic: Clinical - Medical Advice >> Oct 01, 2023  8:16 AM Myrick T wrote: Reason for CRM: patient called stated she was seen on last week and placed on iron  pills. She went to get an iron  infusion and wants to know if she should continue with the iron  pill daily. Please f/u with patient

## 2023-10-02 LAB — COLOGUARD: COLOGUARD: NEGATIVE

## 2023-11-03 ENCOUNTER — Ambulatory Visit: Payer: PRIVATE HEALTH INSURANCE | Admitting: Family Medicine

## 2023-11-03 ENCOUNTER — Encounter: Payer: Self-pay | Admitting: Family Medicine

## 2023-11-03 VITALS — BP 122/74 | HR 84 | Ht 65.0 in | Wt 157.8 lb

## 2023-11-03 DIAGNOSIS — Z0000 Encounter for general adult medical examination without abnormal findings: Secondary | ICD-10-CM | POA: Diagnosis not present

## 2023-11-03 DIAGNOSIS — F5089 Other specified eating disorder: Secondary | ICD-10-CM

## 2023-11-03 DIAGNOSIS — Z23 Encounter for immunization: Secondary | ICD-10-CM

## 2023-11-03 DIAGNOSIS — K9589 Other complications of other bariatric procedure: Secondary | ICD-10-CM | POA: Diagnosis not present

## 2023-11-03 DIAGNOSIS — D508 Other iron deficiency anemias: Secondary | ICD-10-CM | POA: Diagnosis not present

## 2023-11-03 DIAGNOSIS — Z124 Encounter for screening for malignant neoplasm of cervix: Secondary | ICD-10-CM

## 2023-11-03 DIAGNOSIS — Z136 Encounter for screening for cardiovascular disorders: Secondary | ICD-10-CM

## 2023-11-03 LAB — CBC WITH DIFFERENTIAL/PLATELET
Basophils Absolute: 0 x10E3/uL (ref 0.0–0.2)
Basos: 1 %
EOS (ABSOLUTE): 0.2 x10E3/uL (ref 0.0–0.4)
Eos: 5 %
Hematocrit: 39.8 % (ref 34.0–46.6)
Hemoglobin: 12.2 g/dL (ref 11.1–15.9)
Immature Grans (Abs): 0 x10E3/uL (ref 0.0–0.1)
Immature Granulocytes: 0 %
Lymphocytes Absolute: 1.6 x10E3/uL (ref 0.7–3.1)
Lymphs: 41 %
MCH: 26.5 pg — ABNORMAL LOW (ref 26.6–33.0)
MCHC: 30.7 g/dL — ABNORMAL LOW (ref 31.5–35.7)
MCV: 87 fL (ref 79–97)
Monocytes Absolute: 0.3 x10E3/uL (ref 0.1–0.9)
Monocytes: 8 %
Neutrophils Absolute: 1.8 x10E3/uL (ref 1.4–7.0)
Neutrophils: 45 %
Platelets: 282 x10E3/uL (ref 150–450)
RBC: 4.6 x10E6/uL (ref 3.77–5.28)
WBC: 4 x10E3/uL (ref 3.4–10.8)

## 2023-11-03 LAB — LIPID PANEL
Chol/HDL Ratio: 1.9 ratio (ref 0.0–4.4)
Cholesterol, Total: 132 mg/dL (ref 100–199)
HDL: 71 mg/dL (ref >39)
LDL Chol Calc (NIH): 49 mg/dL (ref 0–99)
Triglycerides: 52 mg/dL (ref 0–149)
VLDL Cholesterol Cal: 12 mg/dL (ref 5–40)

## 2023-11-03 NOTE — Progress Notes (Signed)
 Name: Tamara Mckenzie   Date of Visit: 11/03/23   Date of last visit with me: 09/24/2023   CHIEF COMPLAINT:  Chief Complaint  Patient presents with   Annual Exam    Cpe. Fasting.        HPI:  Discussed the use of AI scribe software for clinical note transcription with the patient, who gave verbal consent to proceed.  History of Present Illness   Tamara Mckenzie is a 48 year old female with iron  deficiency anemia who presents for follow-up of her anemia management.  She received one iron  infusion approximately four weeks ago and has been taking oral iron  supplements along with 1000 mg of vitamin C daily. She experiences difficulty consuming acidic juices due to exacerbation of her eczema and gastrointestinal discomfort.  She has a history of bariatric surgery, specifically a duodenal switch, which necessitates a 30-minute interval between eating and drinking. This surgery was also associated with esophageal and hiatal hernias, contributing to significant gastroesophageal reflux disease (GERD).  Heart palpitations, which had resolved about a week after her infusion, have returned intermittently over the past three days. She also notes persistent cold intolerance and difficulty obtaining oxygen saturation readings due to cold extremities.  Previously, she experienced pica, specifically craving ice, which resolved after the iron  infusion. She was consuming three to four bags of ice weekly. Her husband noted this behavior, which has since improved.  She is up to date with her Pap smear and mammogram, both conducted in July, with normal results. She also completed a Cologuard test, which was normal. She has not received recent COVID vaccinations but is current with her flu shot.  She has a known allergy to tetracycline and does not currently use her albuterol  inhaler. She takes Xanax as needed.         OBJECTIVE:       11/03/2023    8:22 AM  Depression screen PHQ 2/9  Decreased  Interest 0  Down, Depressed, Hopeless 0  PHQ - 2 Score 0     BP Readings from Last 3 Encounters:  11/03/23 122/74  09/30/23 121/75  09/24/23 114/70    BP 122/74   Pulse 84   Ht 5' 5 (1.651 m)   Wt 157 lb 12.8 oz (71.6 kg)   BMI 26.26 kg/m    Physical Exam          Physical Exam Constitutional:      Appearance: Normal appearance.  Cardiovascular:     Rate and Rhythm: Normal rate.  Neurological:     General: No focal deficit present.     Mental Status: She is alert and oriented to person, place, and time. Mental status is at baseline.     ASSESSMENT/PLAN:   Assessment & Plan Annual physical exam  Cervical cancer screening  Iron  deficiency anemia following bariatric surgery  Pica  Encounter for screening for cardiovascular disorders    Assessment and Plan    Iron  deficiency anemia Likely related to previous bariatric surgery. Symptoms improved post-iron  infusion but recurred, indicating persistent anemia. GERD limits vitamin C intake, affecting iron  absorption. - Order iron  panel and hemoglobin test. - Refer to hematology for further evaluation and management. - Continue oral iron  supplementation with vitamin C. - Discuss alternative iron  supplementation forms, such as liquid iron .  Gastroesophageal reflux disease (GERD) Symptoms exacerbated by acidic foods and beverages. Esophageal and hiatal hernia contribute to symptoms. - Advise to avoid acidic foods and beverages.  Adult Wellness Visit Routine  visit with normal Pap smear, mammogram, and Cologuard test. Discussed flu and COVID vaccinations; COVID vaccine deferred as she is not at high risk. - Flu vaccine administered  - Order cholesterol test. - Provide form for medical records release from OB/GYN. - Discuss hepatitis B vaccination history.         Camani Sesay A. Vita MD [facility]

## 2023-11-04 ENCOUNTER — Ambulatory Visit: Payer: Self-pay | Admitting: Family Medicine

## 2023-11-04 LAB — IRON AND TIBC
Iron Saturation: 19 % (ref 15–55)
Iron: 80 ug/dL (ref 27–159)
Total Iron Binding Capacity: 417 ug/dL (ref 250–450)
UIBC: 337 ug/dL (ref 131–425)

## 2023-11-04 LAB — FERRITIN: Ferritin: 30 ng/mL (ref 15–150)

## 2023-11-05 ENCOUNTER — Encounter: Payer: Self-pay | Admitting: Obstetrics and Gynecology

## 2023-11-24 ENCOUNTER — Telehealth: Payer: Self-pay

## 2023-11-24 NOTE — Telephone Encounter (Signed)
 Pt called and LVM asking if we could tell her what her copay will be for her visit with Dr Loretha tomorrow. Called pt vack and LVM advising that we are not able to see her financial information and she could try logging in to MyChart to start an e-checkin to see if that will show a copay, or she could call her insurance company to find out what a specialist copay costs her.

## 2023-11-25 ENCOUNTER — Inpatient Hospital Stay: Payer: PRIVATE HEALTH INSURANCE | Attending: Hematology and Oncology | Admitting: Hematology and Oncology

## 2023-11-25 ENCOUNTER — Inpatient Hospital Stay: Payer: PRIVATE HEALTH INSURANCE

## 2023-11-25 VITALS — BP 132/89 | HR 69 | Temp 98.2°F | Resp 17 | Wt 155.1 lb

## 2023-11-25 DIAGNOSIS — D509 Iron deficiency anemia, unspecified: Secondary | ICD-10-CM | POA: Diagnosis present

## 2023-11-25 DIAGNOSIS — E282 Polycystic ovarian syndrome: Secondary | ICD-10-CM | POA: Insufficient documentation

## 2023-11-27 ENCOUNTER — Encounter: Payer: Self-pay | Admitting: Hematology and Oncology

## 2023-11-27 NOTE — Progress Notes (Signed)
 [facility] CONSULT NOTE  Patient Care Team: Vita Morrow, MD as PCP - General (Family Medicine)  CHIEF COMPLAINTS/PURPOSE OF CONSULTATION:  IDA  ASSESSMENT & PLAN:   This is a very pleasant 48 yr old female with IDA referred for additional recommendations.  Iron  deficiency anemia affects a large proportion of the wellness population especially females of childbearing age and children.  Common causes of iron  deficiency include blood loss, reduced iron  absorption because of previous surgeries, dietary restrictions or other malabsorption issues, medications that reduce gastric acidity are due to inherited disorders such as IRIDA due to TMPRSS6 mutation. Symptoms of iron  deficiency usually include fatigue, pica, restless legs, exercise intolerance, exertional dyspnea, headaches and weakness. Diagnosis can be usually made by history, physical examination, CBC and iron  studies.  We generally treat iron  deficiency anemia with oral supplements if this can be tolerated.  IV iron  is appropriate for patients are unable to tolerate due to gastrointestinal side effects or for patients with severe/ongoing blood loss, history of gastric bypass which reduces gastric acid and henceforth will impair intestinal absorption of oral iron , malabsorption syndromes are occasional in pregnancy.  There are several formularies of intravenous iron  available in the market.  We have discussed about risk of allergic/infusion reactions including potentially life-threatening anaphylaxis with intravenous iron  however these serious allergic reactions are exceedingly rare and overestimated.  In contrast to serious allergic reactions, IV iron  may be associated with nonallergic infusion reactions including self-limited urticaria, palpitations, dizziness, neck and back spasm which again occur in less than 1% of the individuals and do not progress to more serious reactions.  She tolerated ferraheme infusion in August well,   We will schedule her for another one of ferraheme infusion She can RTC in 3-4 months with labs.    HISTORY OF PRESENTING ILLNESS:  Tamara Mckenzie 48 y.o. female is here because of anemia  Discussed the use of AI scribe software for clinical note transcription with the patient, who gave verbal consent to proceed.  History of Present Illness Tamara Mckenzie is a 48 year old female with a history of duodenal switch surgery and polycystic ovary syndrome (PCOS) who presents with symptoms of anemia.  She experiences symptoms such as 'fogginess', heart palpitations, and tingling in her legs, described as feeling 'wobbly' and like 'ants'. These symptoms have been present intermittently since her duodenal switch surgery in 2021, worsening over the past six to eight months.  She feels cold most of the time, except during hot flashes, and experiences significant fatigue, often feeling like she can 'sit down and just go to sleep'.  She is currently menstruating with irregular cycles due to polycystic ovary syndrome (PCOS), but does not experience heavy bleeding during menstruation.  Recent blood work showed a hemoglobin level over twelve and a ferritin value of approximately 30, as reported by the doctor.  All other systems were reviewed with the patient and are negative.  MEDICAL HISTORY:  Past Medical History:  Diagnosis Date   Allergy    Chronic headache    Hypertension    IBS (irritable bowel syndrome)    Insomnia    Mood disorder     SURGICAL HISTORY: Past Surgical History:  Procedure Laterality Date   CHOLECYSTECTOMY  10/06/2019   OTHER SURGICAL HISTORY  05/10/2019   duodenal switch for weight loss    SOCIAL HISTORY: Social History   Socioeconomic History   Marital status: Married    Spouse name: Lemond   Number of children:  0   Years of education: Not on file   Highest education level: Bachelor's degree (e.g., BA, AB, BS)  Occupational History   Not on file  Tobacco  Use   Smoking status: Never   Smokeless tobacco: Never  Substance and Sexual Activity   Alcohol use: No   Drug use: No   Sexual activity: Yes    Birth control/protection: Condom  Other Topics Concern   Not on file  Social History Narrative   Lives with husband   Caffeine- none   Social Drivers of Corporate investment banker Strain: Low Risk  (09/23/2023)   Overall Financial Resource Strain (CARDIA)    Difficulty of Paying Living Expenses: Not very hard  Food Insecurity: No Food Insecurity (11/25/2023)   Hunger Vital Sign    Worried About Running Out of Food in the Last Year: Never true    Ran Out of Food in the Last Year: Never true  Transportation Needs: No Transportation Needs (09/24/2023)   Received from [facility]    In the past 12 months, has lack of reliable transportation kept you from medical appointments, meetings, work or from getting things needed for daily living? : No  Physical Activity: Inactive (09/23/2023)   Exercise Vital Sign    Days of Exercise per Week: 0 days    Minutes of Exercise per Session: Not on file  Stress: No Stress Concern Present (09/23/2023)   [employer] of Occupational Health - Occupational Stress Questionnaire    Feeling of Stress: Only a little  Social Connections: Socially Integrated (09/23/2023)   Social Connection and Isolation Panel    Frequency of Communication with Friends and Family: More than three times a week    Frequency of Social Gatherings with Friends and Family: Once a week    Attends Religious Services: More than 4 times per year    Active Member of [employer] or Organizations: Yes    Attends Banker Meetings: 1 to 4 times per year    Marital Status: Married  Catering manager Violence: Not At Risk (11/25/2023)   Humiliation, Afraid, Rape, and Kick questionnaire    Fear of Current or Ex-Partner: No    Emotionally Abused: No    Physically Abused: No    Sexually Abused: No    FAMILY  HISTORY: Family History  Problem Relation Age of Onset   Hypertension Mother    Hypertension Father    Hyperlipidemia Father    Stroke Maternal Grandfather    Heart disease Paternal Grandfather     ALLERGIES:  is allergic to other and lactose.  MEDICATIONS:  Current Outpatient Medications  Medication Sig Dispense Refill   Adapalene -Benzoyl Peroxide  0.1-2.5 % gel Apply a thin film topically to affected area of face or trunk once daily; use a pea-sized quantity for each area of the face (eg, forehead, chin, each cheek) (Patient not taking: Reported on 11/03/2023) 45 g 0   Ascorbic Acid (VITAMIN C) 1000 MG tablet Take 1,000 mg by mouth daily.     Biotin 89999 MCG TABS Take by mouth.     Calcium Citrate-Vitamin D  315-5 MG-MCG TABS Take 1 tablet by mouth.     Homeopathic Products (AZO YEAST PLUS PO) Take by mouth.     Iron , Ferrous Sulfate , 325 (65 Fe) MG TABS Take 325 mg by mouth daily. 90 tablet 0   Multiple Vitamins-Minerals (MULTIVITAMIN WITH MINERALS) tablet Take 1 tablet by mouth daily.     Vitamin D , Ergocalciferol , (  DRISDOL ) 1.25 MG (50000 UNIT) CAPS capsule Take 1 capsule (50,000 Units total) by mouth every other day. 45 capsule 0   ZINC  PICOLINATE PO Take 30 mg by mouth.     No current facility-administered medications for this visit.     PHYSICAL EXAMINATION: ECOG PERFORMANCE STATUS: 0 - Asymptomatic  Vitals:   11/25/23 1415  BP: 132/89  Pulse: 69  Resp: 17  Temp: 98.2 F (36.8 C)  SpO2: 99%   Filed Weights   11/25/23 1415  Weight: 155 lb 1.6 oz (70.4 kg)    GENERAL:alert, no distress and comfortable SKIN: skin color, texture, turgor are normal, no rashes or significant lesions EYES: normal, conjunctiva are pink and non-injected, sclera clear OROPHARYNX:no exudate, no erythema and lips, buccal mucosa, and tongue normal  NECK: supple, thyroid  normal size, non-tender, without nodularity LYMPH:  no palpable lymphadenopathy in the cervical, axillary LUNGS: clear to  auscultation and percussion with normal breathing effort HEART: regular rate & rhythm and no murmurs and no lower extremity edema ABDOMEN:abdomen soft, non-tender and normal bowel sounds Musculoskeletal:no cyanosis of digits and no clubbing  PSYCH: alert & oriented x 3 with fluent speech NEURO: no focal motor/sensory deficits  LABORATORY DATA:  I have reviewed the data as listed Lab Results  Component Value Date   WBC 4.0 11/03/2023   HGB 12.2 11/03/2023   HCT 39.8 11/03/2023   MCV 87 11/03/2023   PLT 282 11/03/2023     Chemistry      Component Value Date/Time   NA 135 06/11/2022 2051   NA 142 06/21/2020 1113   K 4.1 06/11/2022 2051   CL 107 06/11/2022 2051   CO2 22 06/11/2022 2051   BUN 19 06/11/2022 2051   BUN 12 06/21/2020 1113   CREATININE 0.77 06/11/2022 2051   CREATININE 0.70 08/02/2016 1550      Component Value Date/Time   CALCIUM 8.6 (L) 06/11/2022 2051   ALKPHOS 108 06/21/2020 1113   AST 17 06/21/2020 1113   ALT 22 06/21/2020 1113   BILITOT 0.5 06/21/2020 1113       RADIOGRAPHIC STUDIES: I have personally reviewed the radiological images as listed and agreed with the findings in the report. No results found.  All questions were answered. The patient knows to call the clinic with any problems, questions or concerns. I spent 45 minutes in the care of this patient including H and P, review of records, counseling and coordination of care.     Amber Stalls, MD 11/27/2023 1:27 PM

## 2023-12-03 ENCOUNTER — Telehealth: Payer: Self-pay

## 2023-12-03 NOTE — Telephone Encounter (Signed)
 Dr. Loretha, patient has been approved for free drug and will be scheduled as soon as possible.  Patient is receiving Advance Medication - Supplied Externally. Medication: Feraheme Manufacture: [employer] Approval Dates: Approved from 09/26/2023 until 02/25/2024. ID:  Reason: Self Pay

## 2023-12-08 ENCOUNTER — Ambulatory Visit (INDEPENDENT_AMBULATORY_CARE_PROVIDER_SITE_OTHER): Payer: PRIVATE HEALTH INSURANCE

## 2023-12-08 VITALS — BP 136/85 | HR 60 | Temp 98.6°F | Resp 14 | Ht 64.0 in | Wt 157.2 lb

## 2023-12-08 DIAGNOSIS — D509 Iron deficiency anemia, unspecified: Secondary | ICD-10-CM | POA: Diagnosis not present

## 2023-12-08 DIAGNOSIS — D508 Other iron deficiency anemias: Secondary | ICD-10-CM

## 2023-12-08 MED ORDER — DIPHENHYDRAMINE HCL 25 MG PO CAPS: 25.0000 mg | ORAL_CAPSULE | Freq: Once | ORAL | Status: DC

## 2023-12-08 MED ORDER — SODIUM CHLORIDE 0.9 % IV SOLN
510.0000 mg | Freq: Once | INTRAVENOUS | Status: AC
  Administered 2023-12-08: 510 mg via INTRAVENOUS
  Filled 2023-12-08: qty 17

## 2023-12-08 MED ORDER — ACETAMINOPHEN 325 MG PO TABS: 650.0000 mg | ORAL_TABLET | Freq: Once | ORAL | Status: DC

## 2023-12-08 NOTE — Progress Notes (Signed)
 Diagnosis: Iron  Deficiency Anemia  Provider:  Praveen Mannam MD  Procedure: IV Infusion  IV Type: Peripheral, IV Location: L Antecubital  Feraheme (Ferumoxytol ), Dose: 510 mg  Infusion Start Time: 1233  Infusion Stop Time: 1449  Post Infusion IV Care: Observation period completed and Peripheral IV Discontinued  Discharge: Condition: Good, Destination: Home . AVS Declined  Performed by:  Lovina Zuver, RN

## 2023-12-15 ENCOUNTER — Other Ambulatory Visit: Payer: Self-pay | Admitting: Family Medicine

## 2023-12-16 ENCOUNTER — Inpatient Hospital Stay: Payer: PRIVATE HEALTH INSURANCE | Admitting: Hematology and Oncology

## 2023-12-30 ENCOUNTER — Inpatient Hospital Stay: Payer: PRIVATE HEALTH INSURANCE | Attending: Hematology and Oncology | Admitting: Hematology and Oncology

## 2023-12-30 DIAGNOSIS — D509 Iron deficiency anemia, unspecified: Secondary | ICD-10-CM | POA: Diagnosis not present

## 2023-12-30 NOTE — Progress Notes (Signed)
 [facility] CONSULT NOTE  Patient Care Team: Vita Morrow, MD as PCP - General (Family Medicine)  CHIEF COMPLAINTS/PURPOSE OF CONSULTATION:  IDA  ASSESSMENT & PLAN:   This is a very pleasant 48 yr old female with IDA referred for additional recommendations.  Since her last visit here, she received an additional dose of Feraheme.  She continues to report ongoing fatigue, feeling cold in her fingers and some headaches.  I have recommended that we repeat labs, CBC, iron  panel and ferritin ordered.  Message sent to our scheduling team and nursing team to schedule labs on patient's preferred day.  Once she has her labs available, we will discuss results and additional recommendations.  She is okay with this plan.  I connected with  Vasti K Huckeba on 12/30/23 by a telephone application and verified that I am speaking with the correct person using two identifiers.   I discussed the limitations of evaluation and management by telemedicine. The patient expressed understanding and agreed to proceed.  Location of pt: home Location of provider: office.  HISTORY OF PRESENTING ILLNESS:  Makila K Lisa 48 y.o. female is here because of anemia  Discussed the use of AI scribe software for clinical note transcription with the patient, who gave verbal consent to proceed.  History of Present Illness Viveka Wilmeth Savoia is a 48 year old female with a history of duodenal switch surgery and polycystic ovary syndrome (PCOS) who presents for IDA.  She is here for telephone visit.  Since her last visit with me, she received another dose of Feraheme.  She tells me that she continues to feel unwell.  Some days are better than other days.  She feels very tired, feels cold fingers, numbness in her fingers and occasional headaches from time to time.  She does not wake up tired but she becomes tired after some activity. No other concerning complaints.  MEDICAL HISTORY:  Past Medical History:  Diagnosis  Date   Allergy    Chronic headache    Hypertension    IBS (irritable bowel syndrome)    Insomnia    Mood disorder     SURGICAL HISTORY: Past Surgical History:  Procedure Laterality Date   CHOLECYSTECTOMY  10/06/2019   OTHER SURGICAL HISTORY  05/10/2019   duodenal switch for weight loss    SOCIAL HISTORY: Social History   Socioeconomic History   Marital status: Married    Spouse name: Lemond   Number of children: 0   Years of education: Not on file   Highest education level: Bachelor's degree (e.g., BA, AB, BS)  Occupational History   Not on file  Tobacco Use   Smoking status: Never   Smokeless tobacco: Never  Substance and Sexual Activity   Alcohol use: No   Drug use: No   Sexual activity: Yes    Birth control/protection: Condom  Other Topics Concern   Not on file  Social History Narrative   Lives with husband   Caffeine- none   Social Drivers of Corporate Investment Banker Strain: Low Risk  (09/23/2023)   Overall Financial Resource Strain (CARDIA)    Difficulty of Paying Living Expenses: Not very hard  Food Insecurity: No Food Insecurity (11/25/2023)   Hunger Vital Sign    Worried About Running Out of Food in the Last Year: Never true    Ran Out of Food in the Last Year: Never true  Transportation Needs: No Transportation Needs (09/24/2023)   Received from [facility]  Transportation    In the past 12 months, has lack of reliable transportation kept you from medical appointments, meetings, work or from getting things needed for daily living? : No  Physical Activity: Inactive (09/23/2023)   Exercise Vital Sign    Days of Exercise per Week: 0 days    Minutes of Exercise per Session: Not on file  Stress: No Stress Concern Present (09/23/2023)   [employer] of Occupational Health - Occupational Stress Questionnaire    Feeling of Stress: Only a little  Social Connections: Socially Integrated (09/23/2023)   Social Connection and Isolation Panel     Frequency of Communication with Friends and Family: More than three times a week    Frequency of Social Gatherings with Friends and Family: Once a week    Attends Religious Services: More than 4 times per year    Active Member of [employer] or Organizations: Yes    Attends Banker Meetings: 1 to 4 times per year    Marital Status: Married  Catering Manager Violence: Not At Risk (11/25/2023)   Humiliation, Afraid, Rape, and Kick questionnaire    Fear of Current or Ex-Partner: No    Emotionally Abused: No    Physically Abused: No    Sexually Abused: No    FAMILY HISTORY: Family History  Problem Relation Age of Onset   Hypertension Mother    Hypertension Father    Hyperlipidemia Father    Stroke Maternal Grandfather    Heart disease Paternal Grandfather     ALLERGIES:  is allergic to other and lactose.  MEDICATIONS:  Current Outpatient Medications  Medication Sig Dispense Refill   Adapalene -Benzoyl Peroxide  0.1-2.5 % gel Apply a thin film topically to affected area of face or trunk once daily; use a pea-sized quantity for each area of the face (eg, forehead, chin, each cheek) (Patient not taking: Reported on 11/03/2023) 45 g 0   Ascorbic Acid (VITAMIN C) 1000 MG tablet Take 1,000 mg by mouth daily.     Biotin 89999 MCG TABS Take by mouth.     Calcium Citrate-Vitamin D  315-5 MG-MCG TABS Take 1 tablet by mouth.     ferrous sulfate  325 (65 FE) MG tablet TAKE 325 MG BY MOUTH DAILY. 90 tablet 0   Homeopathic Products (AZO YEAST PLUS PO) Take by mouth.     Multiple Vitamins-Minerals (MULTIVITAMIN WITH MINERALS) tablet Take 1 tablet by mouth daily.     ZINC  PICOLINATE PO Take 30 mg by mouth.     No current facility-administered medications for this visit.     PHYSICAL EXAMINATION: ECOG PERFORMANCE STATUS: 0 - Asymptomatic  There were no vitals filed for this visit.  There were no vitals filed for this visit.   GENERAL:alert, no distress and comfortable   LABORATORY  DATA:  I have reviewed the data as listed Lab Results  Component Value Date   WBC 4.0 11/03/2023   HGB 12.2 11/03/2023   HCT 39.8 11/03/2023   MCV 87 11/03/2023   PLT 282 11/03/2023     Chemistry      Component Value Date/Time   NA 135 06/11/2022 2051   NA 142 06/21/2020 1113   K 4.1 06/11/2022 2051   CL 107 06/11/2022 2051   CO2 22 06/11/2022 2051   BUN 19 06/11/2022 2051   BUN 12 06/21/2020 1113   CREATININE 0.77 06/11/2022 2051   CREATININE 0.70 08/02/2016 1550      Component Value Date/Time   CALCIUM 8.6 (L) 06/11/2022 2051  ALKPHOS 108 06/21/2020 1113   AST 17 06/21/2020 1113   ALT 22 06/21/2020 1113   BILITOT 0.5 06/21/2020 1113       RADIOGRAPHIC STUDIES: I have personally reviewed the radiological images as listed and agreed with the findings in the report. No results found.  All questions were answered. The patient knows to call the clinic with any problems, questions or concerns. I spent 10 minutes in the care of this patient including H and P, review of records, counseling and coordination of care.     Amber Stalls, MD 12/30/2023 2:25 PM

## 2024-01-05 ENCOUNTER — Inpatient Hospital Stay: Payer: PRIVATE HEALTH INSURANCE

## 2024-01-05 DIAGNOSIS — D509 Iron deficiency anemia, unspecified: Secondary | ICD-10-CM

## 2024-01-05 LAB — CBC WITH DIFFERENTIAL/PLATELET
Abs Immature Granulocytes: 0 K/uL (ref 0.00–0.07)
Basophils Absolute: 0 K/uL (ref 0.0–0.1)
Basophils Relative: 1 %
Eosinophils Absolute: 0.2 K/uL (ref 0.0–0.5)
Eosinophils Relative: 4 %
HCT: 38.4 % (ref 36.0–46.0)
Hemoglobin: 12.9 g/dL (ref 12.0–15.0)
Immature Granulocytes: 0 %
Lymphocytes Relative: 35 %
Lymphs Abs: 1.6 K/uL (ref 0.7–4.0)
MCH: 30.7 pg (ref 26.0–34.0)
MCHC: 33.6 g/dL (ref 30.0–36.0)
MCV: 91.4 fL (ref 80.0–100.0)
Monocytes Absolute: 0.3 K/uL (ref 0.1–1.0)
Monocytes Relative: 6 %
Neutro Abs: 2.5 K/uL (ref 1.7–7.7)
Neutrophils Relative %: 54 %
Platelets: 256 K/uL (ref 150–400)
RBC: 4.2 MIL/uL (ref 3.87–5.11)
RDW: 15.2 % (ref 11.5–15.5)
WBC: 4.6 K/uL (ref 4.0–10.5)
nRBC: 0 % (ref 0.0–0.2)

## 2024-01-05 LAB — IRON AND IRON BINDING CAPACITY (CC-WL,HP ONLY)
Iron: 42 ug/dL (ref 28–170)
Saturation Ratios: 12 % (ref 10.4–31.8)
TIBC: 364 ug/dL (ref 250–450)
UIBC: 322 ug/dL (ref 148–442)

## 2024-01-05 LAB — FERRITIN: Ferritin: 127 ng/mL (ref 11–307)

## 2024-01-12 ENCOUNTER — Telehealth: Payer: Self-pay

## 2024-01-12 NOTE — Telephone Encounter (Signed)
 Patient called asking about her bill for [facility] on DOS 09/30/23. Allegra is a penny charge on the bill but she was charged for pre meds. Patient states she did not receive pre meds on 09/30/23 or 12/08/23 and should not be charged for that. I have sent an email asking about how this needs to be handled to Marcey Holms and [place]. I will update here when I have more information. I also let the patient know how I was handling this and that I would call her back when I have more information, she verbalized understanding and appreciation.

## 2024-03-12 NOTE — Telephone Encounter (Signed)
 Billing fixed the issues. The time of the infusion is what is being billed, not pre meds. They did have to fix the infusion times. What is left on the bill is the patients responsibility.  I called the patient to give her this information and had to leave a voicemail.

## 2024-03-16 ENCOUNTER — Other Ambulatory Visit: Payer: Self-pay | Admitting: Family Medicine

## 2024-05-06 ENCOUNTER — Ambulatory Visit: Payer: PRIVATE HEALTH INSURANCE | Admitting: Family Medicine
